# Patient Record
Sex: Male | Born: 1965 | Race: Black or African American | Hispanic: No | Marital: Married | Smoking: Former smoker
Health system: Southern US, Community
[De-identification: ages and names within clinical notes are randomized; demographics above are authoritative.]

## PROBLEM LIST (undated history)

## (undated) DIAGNOSIS — I10 Essential (primary) hypertension: Secondary | ICD-10-CM

## (undated) DIAGNOSIS — N281 Cyst of kidney, acquired: Secondary | ICD-10-CM

## (undated) DIAGNOSIS — R7303 Prediabetes: Secondary | ICD-10-CM

## (undated) DIAGNOSIS — N289 Disorder of kidney and ureter, unspecified: Secondary | ICD-10-CM

## (undated) DIAGNOSIS — G4730 Sleep apnea, unspecified: Secondary | ICD-10-CM

## (undated) DIAGNOSIS — Q619 Cystic kidney disease, unspecified: Secondary | ICD-10-CM

## (undated) DIAGNOSIS — K219 Gastro-esophageal reflux disease without esophagitis: Secondary | ICD-10-CM

## (undated) DIAGNOSIS — J302 Other seasonal allergic rhinitis: Secondary | ICD-10-CM

## (undated) DIAGNOSIS — D3500 Benign neoplasm of unspecified adrenal gland: Secondary | ICD-10-CM

## (undated) DIAGNOSIS — R972 Elevated prostate specific antigen [PSA]: Secondary | ICD-10-CM

## (undated) DIAGNOSIS — N400 Enlarged prostate without lower urinary tract symptoms: Secondary | ICD-10-CM

## (undated) DIAGNOSIS — M1990 Unspecified osteoarthritis, unspecified site: Secondary | ICD-10-CM

## (undated) DIAGNOSIS — N419 Inflammatory disease of prostate, unspecified: Secondary | ICD-10-CM

## (undated) DIAGNOSIS — N411 Chronic prostatitis: Secondary | ICD-10-CM

## (undated) DIAGNOSIS — M109 Gout, unspecified: Secondary | ICD-10-CM

## (undated) HISTORY — DX: Essential (primary) hypertension: I10

## (undated) HISTORY — DX: Benign prostatic hyperplasia without lower urinary tract symptoms: N40.0

## (undated) HISTORY — DX: Cyst of kidney, acquired: N28.1

## (undated) HISTORY — DX: Inflammatory disease of prostate, unspecified: N41.9

## (undated) HISTORY — DX: Other seasonal allergic rhinitis: J30.2

## (undated) HISTORY — DX: Sleep apnea, unspecified: G47.30

## (undated) HISTORY — PX: CYSTOSCOPY: SUR368

## (undated) HISTORY — DX: Prediabetes: R73.03

## (undated) HISTORY — DX: Disorder of kidney and ureter, unspecified: N28.9

## (undated) HISTORY — DX: Cystic kidney disease, unspecified: Q61.9

## (undated) HISTORY — DX: Chronic prostatitis: N41.1

## (undated) HISTORY — DX: Elevated prostate specific antigen (PSA): R97.20

---

## 2006-11-29 ENCOUNTER — Ambulatory Visit: Payer: Self-pay | Admitting: Family Medicine

## 2007-03-21 ENCOUNTER — Ambulatory Visit: Payer: Self-pay | Admitting: Family Medicine

## 2007-05-30 ENCOUNTER — Ambulatory Visit: Payer: Self-pay | Admitting: Family Medicine

## 2008-04-04 ENCOUNTER — Ambulatory Visit: Payer: Self-pay | Admitting: Family Medicine

## 2011-10-21 ENCOUNTER — Encounter: Payer: Self-pay | Admitting: Cardiovascular Disease

## 2011-10-21 LAB — HEMOGLOBIN: HGB: 17.7 g/dL (ref 13.0–18.0)

## 2011-10-27 LAB — CBC WITH DIFFERENTIAL/PLATELET
Basophil #: 0 10*3/uL (ref 0.0–0.1)
Eosinophil %: 5.3 %
Lymphocyte #: 1.6 10*3/uL (ref 1.0–3.6)
MCH: 30.2 pg (ref 26.0–34.0)
MCV: 89 fL (ref 80–100)
Monocyte %: 11.8 %
Neutrophil %: 48.1 %
Platelet: 171 10*3/uL (ref 150–440)
RBC: 5.41 10*6/uL (ref 4.40–5.90)
RDW: 13.3 % (ref 11.5–14.5)
WBC: 4.7 10*3/uL (ref 3.8–10.6)

## 2011-11-21 ENCOUNTER — Encounter: Payer: Self-pay | Admitting: Cardiovascular Disease

## 2011-12-02 ENCOUNTER — Ambulatory Visit: Payer: Self-pay | Admitting: Podiatry

## 2012-11-15 DIAGNOSIS — Q619 Cystic kidney disease, unspecified: Secondary | ICD-10-CM | POA: Insufficient documentation

## 2012-11-15 HISTORY — DX: Cystic kidney disease, unspecified: Q61.9

## 2013-12-26 DIAGNOSIS — J302 Other seasonal allergic rhinitis: Secondary | ICD-10-CM

## 2013-12-26 DIAGNOSIS — E669 Obesity, unspecified: Secondary | ICD-10-CM | POA: Insufficient documentation

## 2013-12-26 DIAGNOSIS — N281 Cyst of kidney, acquired: Secondary | ICD-10-CM

## 2013-12-26 DIAGNOSIS — E559 Vitamin D deficiency, unspecified: Secondary | ICD-10-CM | POA: Insufficient documentation

## 2013-12-26 HISTORY — DX: Cyst of kidney, acquired: N28.1

## 2013-12-26 HISTORY — DX: Other seasonal allergic rhinitis: J30.2

## 2014-01-22 DIAGNOSIS — M109 Gout, unspecified: Secondary | ICD-10-CM | POA: Insufficient documentation

## 2014-01-22 DIAGNOSIS — R7302 Impaired glucose tolerance (oral): Secondary | ICD-10-CM | POA: Insufficient documentation

## 2014-01-22 DIAGNOSIS — R7303 Prediabetes: Secondary | ICD-10-CM

## 2014-01-22 HISTORY — DX: Prediabetes: R73.03

## 2014-10-16 ENCOUNTER — Other Ambulatory Visit: Payer: Self-pay | Admitting: Urology

## 2014-10-21 DIAGNOSIS — N419 Inflammatory disease of prostate, unspecified: Secondary | ICD-10-CM | POA: Insufficient documentation

## 2014-10-21 HISTORY — DX: Inflammatory disease of prostate, unspecified: N41.9

## 2014-11-21 ENCOUNTER — Ambulatory Visit (INDEPENDENT_AMBULATORY_CARE_PROVIDER_SITE_OTHER): Payer: PRIVATE HEALTH INSURANCE | Admitting: Obstetrics and Gynecology

## 2014-11-21 ENCOUNTER — Encounter: Payer: Self-pay | Admitting: Obstetrics and Gynecology

## 2014-11-21 VITALS — BP 131/76 | HR 49 | Ht 74.0 in | Wt 306.6 lb

## 2014-11-21 DIAGNOSIS — IMO0002 Reserved for concepts with insufficient information to code with codable children: Secondary | ICD-10-CM | POA: Insufficient documentation

## 2014-11-21 DIAGNOSIS — I10 Essential (primary) hypertension: Secondary | ICD-10-CM

## 2014-11-21 DIAGNOSIS — N411 Chronic prostatitis: Secondary | ICD-10-CM | POA: Diagnosis not present

## 2014-11-21 DIAGNOSIS — N289 Disorder of kidney and ureter, unspecified: Secondary | ICD-10-CM

## 2014-11-21 DIAGNOSIS — G4730 Sleep apnea, unspecified: Secondary | ICD-10-CM | POA: Insufficient documentation

## 2014-11-21 DIAGNOSIS — N400 Enlarged prostate without lower urinary tract symptoms: Secondary | ICD-10-CM | POA: Insufficient documentation

## 2014-11-21 HISTORY — DX: Benign prostatic hyperplasia without lower urinary tract symptoms: N40.0

## 2014-11-21 HISTORY — DX: Disorder of kidney and ureter, unspecified: N28.9

## 2014-11-21 HISTORY — DX: Essential (primary) hypertension: I10

## 2014-11-21 LAB — URINALYSIS, COMPLETE
Bilirubin, UA: NEGATIVE
GLUCOSE, UA: NEGATIVE
Ketones, UA: NEGATIVE
LEUKOCYTES UA: NEGATIVE
Nitrite, UA: NEGATIVE
PROTEIN UA: NEGATIVE
SPEC GRAV UA: 1.015 (ref 1.005–1.030)
Urobilinogen, Ur: 0.2 mg/dL (ref 0.2–1.0)
pH, UA: 5.5 (ref 5.0–7.5)

## 2014-11-21 LAB — MICROSCOPIC EXAMINATION: RBC MICROSCOPIC, UA: NONE SEEN /HPF (ref 0–?)

## 2014-11-21 NOTE — Progress Notes (Signed)
11/21/2014 9:14 PM   Jason Tran 02-07-1966 [id number]  Referring provider: No referring provider defined for this encounter.  Chief Complaint  Patient presents with  . Prostatitis    x 4 mth f/u     HPI: Mr. Jason Tran is a 49 year old African-American male presenting for his 4 month follow-up for chronic prostatitis. He was last seen by [name] on 08/02/58. A cystoscopy was performed at that time was unremarkable except for mild inflammation of prostate. Patient was instructed to continue taking Bactrim for at least 2 more months and was given refills. Patient reports he has been taking daily Bactrim for the last 4 months and also a mail order medication called super beta prostate. He states that his symptoms have significantly improved including his urinary frequency and perineal discomfort. He states that he just got another refill for Bactrim for 1 month and was planning on completing it prior to taking a break from antibiotics to see if symptoms return.  07/02/14 PSA 0.4    PMH: Past Medical History  Diagnosis Date  . Sleep apnea   . HTN (hypertension)   . Renal disease   . BPH (benign prostatic hyperplasia)   . Chronic prostatitis   . Elevated PSA   . BP (high blood pressure) 11/21/2014  . Allergic rhinitis, seasonal 12/26/2013  . 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 Prostatitis 10/21/2014  . Disorder of kidney 11/21/2014    Surgical History: Past Surgical History  Procedure Laterality Date  . Cystoscopy      Home Medications:    Medication List       This list is accurate as of: 11/21/14  9:14 PM.  Always use your most recent med list.               eplerenone 50 MG tablet  Commonly known as:  INSPRA     fluticasone 50 MCG/ACT nasal spray  Commonly known as:  FLONASE     furosemide 20 MG tablet    Commonly known as:  LASIX     loratadine 10 MG tablet  Commonly known as:  CLARITIN     metoprolol 200 MG 24 hr tablet  Commonly known as:  TOPROL-XL     sulfamethoxazole-trimethoprim 800-160 MG per tablet  Commonly known as:  BACTRIM DS,SEPTRA DS  take 1 tablet by mouth once daily     tamsulosin 0.4 MG Caps capsule  Commonly known as:  FLOMAX     TRIBENZOR 40-10-25 MG Tabs  Generic drug:  Olmesartan-Amlodipine-HCTZ     Vitamin D (Ergocalciferol) 50000 UNITS Caps capsule  Commonly known as:  DRISDOL        Allergies: No Known Allergies  Family History: Family History  Problem Relation Age of Onset  . Benign prostatic hyperplasia    . Hypertension      Social History:  reports that he quit smoking about 8 years ago. He does not have any smokeless tobacco history on file. He reports that he drinks alcohol. He reports that he does not use illicit drugs.  ROS: UROLOGY Frequent Urination?: No Hard to postpone urination?: No Burning/pain with urination?: No Get up at night to urinate?: No Leakage of urine?: No Urine stream starts and stops?: No Trouble starting stream?: No Do you have to strain to urinate?: No Blood in urine?: No Urinary  tract infection?: No Sexually transmitted disease?: No Injury to kidneys or bladder?: No Painful intercourse?: No Weak stream?: No Erection problems?: No Penile pain?: No  Gastrointestinal Nausea?: No Vomiting?: No Indigestion/heartburn?: No Diarrhea?: No Constipation?: No  Constitutional Fever: No Night sweats?: No Weight loss?: No Fatigue?: No  Skin Skin rash/lesions?: No Itching?: No  Eyes Blurred vision?: No Double vision?: No  Ears/Nose/Throat Sore throat?: No Sinus problems?: No  Hematologic/Lymphatic Swollen glands?: Yes Easy bruising?: No  Cardiovascular Leg swelling?: No Chest pain?: No  Respiratory Cough?: No Shortness of breath?: No  Endocrine Excessive thirst?:  No  Musculoskeletal Back pain?: No Joint pain?: No  Neurological Headaches?: No Dizziness?: No  Psychologic Depression?: No Anxiety?: No  Physical Exam: BP 131/76 mmHg  Pulse 49  Ht 6\' 2"  (1.88 m)  Wt 306 lb 9.6 oz (139.073 kg)  BMI 39.35 kg/m2  Constitutional:  Alert and oriented, No acute distress. HEENT: [place] AT, moist mucus membranes.  Trachea midline, no masses. Cardiovascular: No clubbing, cyanosis, or edema. Respiratory: Normal respiratory effort, no increased work of breathing. GU: No CVA tenderness. Skin: No rashes, bruises or suspicious lesions. Lymph: No cervical adenopathy. Neurologic: Grossly intact, no focal deficits, moving all 4 extremities. Psychiatric: Normal mood and affect.  Laboratory Data: Results for orders placed or performed in visit on 11/21/14  Microscopic Examination  Result Value Ref Range   WBC, UA 0-5 0 -  5 /hpf   RBC, UA None seen 0 -  2 /hpf   Epithelial Cells (non renal) 0-10 0 - 10 /hpf   Bacteria, UA Few (A) None seen/Few  Urinalysis, Complete  Result Value Ref Range   Specific Gravity, UA 1.015 1.005 - 1.030   pH, UA 5.5 5.0 - 7.5   Color, UA Yellow Yellow   Appearance Ur Clear Clear   Leukocytes, UA Negative Negative   Protein, UA Negative Negative/Trace   Glucose, UA Negative Negative   Ketones, UA Negative Negative   RBC, UA Trace (A) Negative   Bilirubin, UA Negative Negative   Urobilinogen, Ur 0.2 0.2 - 1.0 mg/dL   Nitrite, UA Negative Negative   Microscopic Examination See below:     Lab Results  Component Value Date   WBC 4.7 10/27/2011   HGB 16.4 10/27/2011   HCT 47.9 10/27/2011   MCV 89 10/27/2011   PLT 171 10/27/2011    No results found for: CREATININE  No results found for: PSA  No results found for: TESTOSTERONE  No results found for: HGBA1C  Assessment & Plan:  49 year old African-American male with chronic prostatitis.  1. Chronic prostatitis- Patient has completed a 4 month course of Bactrim  and medication called beta prostate with significant improvement in symptoms of frequency and perineal pain. He will complete 1 more month of Bactrim and then stop antibiotics for a trial period. - Urinalysis, Complete  Return in about 6 months (around 05/21/2015) for recheck prostatitis physician.  Herbert Moors, [facility] [address], [place], [place] [address] [phone]

## 2015-05-21 ENCOUNTER — Encounter: Payer: Self-pay | Admitting: Obstetrics and Gynecology

## 2015-05-21 ENCOUNTER — Ambulatory Visit (INDEPENDENT_AMBULATORY_CARE_PROVIDER_SITE_OTHER): Payer: PRIVATE HEALTH INSURANCE | Admitting: Obstetrics and Gynecology

## 2015-05-21 VITALS — BP 160/94 | HR 44 | Resp 16 | Ht 74.0 in | Wt 308.1 lb

## 2015-05-21 DIAGNOSIS — N411 Chronic prostatitis: Secondary | ICD-10-CM

## 2015-05-21 LAB — BLADDER SCAN AMB NON-IMAGING: Scan Result: 17

## 2015-05-21 NOTE — Progress Notes (Signed)
11:48 AM   Jason Tran 10-02-1965 [id number]  Referring provider: [name], MD [place], [place] [address]  Chief Complaint  Patient presents with  . Prostatitis  . Follow-up    HPI: Jason Tran is a 50 year old African-American male presenting for his 4 month follow-up for chronic prostatitis. He was last seen by [name] on 08/01/49. A cystoscopy was performed at that time was unremarkable except for mild inflammation of prostate. Patient was instructed to continue taking Bactrim for at least 2 more months and was given refills. Patient reports he has been taking daily Bactrim for the last 4 months and also a mail order medication called super beta prostate. He states that his symptoms have significantly improved including his urinary frequency and perineal discomfort. He states that he just got another refill for Bactrim for 1 month and was planning on completing it prior to taking a break from antibiotics to see if symptoms return.  07/02/14 PSA 0.4  Interval History 05/22/15 Patient states that he has not been taking any antibiotics over the last few months. He reports his urinary symptoms are much improved including urinary frequency and perineal discomfort. He feels comfortable staying off antibiotics at this time. He is still taking daily tamsulosin.     PMH: Past Medical History  Diagnosis Date  . Sleep apnea   . HTN (hypertension)   . Renal disease   . BPH (benign prostatic hyperplasia)   . Chronic prostatitis   . Elevated PSA   . BP (high blood pressure) 11/21/2014  . Allergic rhinitis, seasonal 12/26/2013  . 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 Prostatitis 10/21/2014  . Disorder of kidney 11/21/2014    Surgical History: Past Surgical History  Procedure  Laterality Date  . Cystoscopy      Home Medications:    Medication List       This list is accurate as of: 05/21/15 11:59 PM.  Always use your most recent med list.               eplerenone 50 MG tablet  Commonly known as:  INSPRA     fluticasone 50 MCG/ACT nasal spray  Commonly known as:  FLONASE     furosemide 20 MG tablet  Commonly known as:  LASIX     indomethacin 50 MG capsule  Commonly known as:  INDOCIN  Reported on 05/21/2015     loratadine 10 MG tablet  Commonly known as:  CLARITIN     metoprolol 200 MG 24 hr tablet  Commonly known as:  TOPROL-XL     RA VITAMIN B-12 TR 1000 MCG Tbcr  Generic drug:  Cyanocobalamin  Take by mouth.     tamsulosin 0.4 MG Caps capsule  Commonly known as:  FLOMAX     TRIBENZOR 40-10-25 MG Tabs  Generic drug:  Olmesartan-Amlodipine-HCTZ     Vitamin D (Ergocalciferol) 50000 units Caps capsule  Commonly known as:  DRISDOL        Allergies: No Known Allergies  Family History: Family History  Problem Relation Age of Onset  . Benign prostatic hyperplasia    . Hypertension      Social History:  reports that he quit smoking about 8 years ago. He does not have any smokeless tobacco history on  file. He reports that he drinks alcohol. He reports that he does not use illicit drugs.  ROS: UROLOGY Frequent Urination?: No Hard to postpone urination?: No Burning/pain with urination?: No Get up at night to urinate?: No Leakage of urine?: No Urine stream starts and stops?: No Trouble starting stream?: No Do you have to strain to urinate?: No Blood in urine?: No Urinary tract infection?: No Sexually transmitted disease?: No Injury to kidneys or bladder?: No Painful intercourse?: No Weak stream?: No Erection problems?: No Penile pain?: No  Gastrointestinal Nausea?: No Vomiting?: No Indigestion/heartburn?: No Diarrhea?: No Constipation?: No  Constitutional Fever: No Night sweats?: No Weight loss?: No Fatigue?:  No  Skin Skin rash/lesions?: No Itching?: No  Eyes Blurred vision?: No Double vision?: No  Ears/Nose/Throat Sore throat?: No Sinus problems?: No  Hematologic/Lymphatic Swollen glands?: No Easy bruising?: No  Cardiovascular Leg swelling?: No Chest pain?: No  Respiratory Cough?: No Shortness of breath?: No  Endocrine Excessive thirst?: No  Musculoskeletal Back pain?: No Joint pain?: No  Neurological Headaches?: No Dizziness?: No  Psychologic Depression?: No Anxiety?: No  Physical Exam: BP 160/94 mmHg  Pulse 44  Resp 16  Ht 6\' 2"  (1.88 m)  Wt 308 lb 1.6 oz (139.753 kg)  BMI 39.54 kg/m2  Constitutional:  Alert and oriented, No acute distress. HEENT: [place] AT, moist mucus membranes.  Trachea midline, no masses. Cardiovascular: No clubbing, cyanosis, or edema. Respiratory: Normal respiratory effort, no increased work of breathing. GU: No CVA tenderness. Skin: No rashes, bruises or suspicious lesions. Lymph: No cervical adenopathy. Neurologic: Grossly intact, no focal deficits, moving all 4 extremities. Psychiatric: Normal mood and affect.  Laboratory Data: Results for orders placed or performed in visit on 05/21/15  BLADDER SCAN AMB NON-IMAGING  Result Value Ref Range   Scan Result 17 mL     Lab Results  Component Value Date   WBC 4.7 10/27/2011   HGB 16.4 10/27/2011   HCT 47.9 10/27/2011   MCV 89 10/27/2011   PLT 171 10/27/2011    No results found for: CREATININE  No results found for: PSA  No results found for: TESTOSTERONE  No results found for: HGBA1C  Assessment & Plan:  50 year old African-American male with chronic prostatitis.  1. Chronic prostatitis- suspected prostatitis resolved. No further antibiotics unless patient becomes acutely symptomatic. We'll continue Flomax. Follow-up for prostate cancer screening.  Patient reports significant improvement in perineal discomfort and frequency.  Symptoms well managed on Flomax.  Patient  not interested in Finasteride d/t possible side effect of erectile dysfunction.   - Urinalysis, Complete  Return in about 2 months (around 07/21/2015) for prostate cancer screening DRE/PSA  with Larene [place].  Herbert Moors, [facility] [address], [place], [place] [address] [phone]

## 2015-07-21 ENCOUNTER — Ambulatory Visit: Payer: PRIVATE HEALTH INSURANCE | Admitting: Urology

## 2015-08-06 ENCOUNTER — Ambulatory Visit (INDEPENDENT_AMBULATORY_CARE_PROVIDER_SITE_OTHER): Payer: PRIVATE HEALTH INSURANCE | Admitting: Urology

## 2015-08-06 ENCOUNTER — Encounter: Payer: Self-pay | Admitting: Urology

## 2015-08-06 VITALS — BP 141/81 | HR 44 | Ht 74.5 in | Wt 306.1 lb

## 2015-08-06 DIAGNOSIS — N401 Enlarged prostate with lower urinary tract symptoms: Secondary | ICD-10-CM | POA: Diagnosis not present

## 2015-08-06 DIAGNOSIS — N411 Chronic prostatitis: Secondary | ICD-10-CM | POA: Diagnosis not present

## 2015-08-06 DIAGNOSIS — N138 Other obstructive and reflux uropathy: Secondary | ICD-10-CM

## 2015-08-06 NOTE — Progress Notes (Signed)
3:59 PM   Jason Tran 20-Jun-1965 [id number]  Referring provider: [name], MD [place], [place] [address]  Chief Complaint  Patient presents with  . Prostatitis    6 month follow up    HPI: Patient is a 50 year old African-American male who presents today for an annual visit for BPH with LUTS and a history of chronic prostatitis  BPH WITH LUTS His IPSS score today is 14, which is moderate lower urinary tract symptomatology. He is mixed with his quality life due to his urinary symptoms.  His major complaint today a weak urinary stream.  He has had these symptoms for 6-7 years.  He denies any dysuria, hematuria or suprapubic pain.  He currently taking tamsulosin 0.4 mg daily, which has helped improve the week urinary stream   He also denies any recent fevers, chills, nausea or vomiting.  He does not have a family history of PCa.      IPSS      08/06/15 1500       International Prostate Symptom Score   How often have you had the sensation of not emptying your bladder? About half the time     How often have you had to urinate less than every two hours? Less than 1 in 5 times     How often have you found you stopped and started again several times when you urinated? About half the time     How often have you found it difficult to postpone urination? Less than 1 in 5 times     How often have you had a weak urinary stream? More than half the time     How often have you had to strain to start urination? Not at All     How many times did you typically get up at night to urinate? 2 Times     Total IPSS Score 14     Quality of Life due to urinary symptoms   If you were to spend the rest of your life with your urinary condition just the way it is now how would you feel about that? Mixed        Score:  1-7 Mild 8-19 Moderate 20-35 Severe  Chronic prostatitis Patient underwent cystoscopy with [name] on 08/02/14 which  noted for mild inflammation of prostate. Patient was instructed to continue taking Bactrim for at least 2 more months and was given refills. Patient reported he took the Bactrim for the last 4 months and also a mail order medication called super beta prostate.  His Bactrim was discontinued and he was prescribed tamsulosin 0.4 mg daily.  He reports his urinary symptoms are much improved including urinary frequency and perineal discomfort. He feels comfortable staying off antibiotics at this time.  He is still taking daily tamsulosin.     PMH: Past Medical History  Diagnosis Date  . Sleep apnea   . HTN (hypertension)   . Renal disease   . BPH (benign prostatic hyperplasia)   . Chronic prostatitis   . Elevated PSA   . BP (high blood pressure) 11/21/2014  . Allergic rhinitis, seasonal 12/26/2013  . 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 Prostatitis 10/21/2014  . Disorder of kidney 11/21/2014    Surgical History: Past Surgical History  Procedure Laterality Date  . Cystoscopy      Home Medications:    Medication List       This list is accurate as of: 08/06/15  3:59 PM.  Always use your most recent med list.               EPINEPHrine 0.3 mg/0.3 mL Soaj injection  Commonly known as:  EPI-PEN  use as directed by prescriber     eplerenone 50 MG tablet  Commonly known as:  INSPRA     fluticasone 50 MCG/ACT nasal spray  Commonly known as:  FLONASE     furosemide 20 MG tablet  Commonly known as:  LASIX     indomethacin 50 MG capsule  Commonly known as:  INDOCIN  Reported on 08/06/2015     loratadine 10 MG tablet  Commonly known as:  CLARITIN     metoprolol 200 MG 24 hr tablet  Commonly known as:  TOPROL-XL     RA VITAMIN B-12 TR 1000 MCG Tbcr  Generic drug:  Cyanocobalamin  Take by mouth.     tamsulosin 0.4 MG Caps  capsule  Commonly known as:  FLOMAX     TRIBENZOR 40-10-25 MG Tabs  Generic drug:  Olmesartan-Amlodipine-HCTZ     Vitamin D (Ergocalciferol) 50000 units Caps capsule  Commonly known as:  DRISDOL        Allergies: No Known Allergies  Family History: Family History  Problem Relation Age of Onset  . Benign prostatic hyperplasia    . Hypertension    . Kidney disease Mother   . Kidney disease Maternal Uncle   . Prostate cancer Neg Hx     Social History:  reports that he quit smoking about 8 years ago. He does not have any smokeless tobacco history on file. He reports that he drinks alcohol. He reports that he does not use illicit drugs.  ROS: UROLOGY Frequent Urination?: No Hard to postpone urination?: No Burning/pain with urination?: No Get up at night to urinate?: No Leakage of urine?: No Urine stream starts and stops?: No Trouble starting stream?: No Do you have to strain to urinate?: No Blood in urine?: No Urinary tract infection?: No Sexually transmitted disease?: No Injury to kidneys or bladder?: No Painful intercourse?: No Weak stream?: No Erection problems?: No Penile pain?: No  Gastrointestinal Nausea?: No Vomiting?: No Indigestion/heartburn?: No Diarrhea?: No Constipation?: No  Constitutional Fever: No Night sweats?: No Weight loss?: No Fatigue?: No  Skin Skin rash/lesions?: No Itching?: No  Eyes Blurred vision?: No Double vision?: No  Ears/Nose/Throat Sore throat?: No Sinus problems?: No  Hematologic/Lymphatic Swollen glands?: No Easy bruising?: No  Cardiovascular Leg swelling?: No Chest pain?: No  Respiratory Cough?: No Shortness of breath?: No  Endocrine Excessive thirst?: No  Musculoskeletal Back pain?: No Joint pain?: No  Neurological Headaches?: No Dizziness?: No  Psychologic Depression?: No Anxiety?: No  Physical Exam: BP 141/81 mmHg  Pulse 44  Ht 6' 2.5" (1.892 m)  Wt 306 lb 1.6 oz (138.846 kg)  BMI  38.79 kg/m2  Constitutional: Well nourished. Alert and oriented, No acute distress. HEENT: [place] AT, moist mucus membranes. Trachea midline, no masses. Cardiovascular: No clubbing, cyanosis, or edema. Respiratory: Normal respiratory effort, no increased work of breathing. GI: Abdomen is soft, non tender, non distended, no abdominal masses. Liver and spleen not palpable.  No hernias appreciated.  Stool sample for occult testing is not indicated.   GU: No CVA tenderness.  No bladder fullness or masses.  Patient with circumcised phallus.  Urethral meatus is patent.  No penile discharge. No penile lesions or rashes. Scrotum without lesions, cysts, rashes and/or edema.  Testicles are located scrotally bilaterally. No masses are appreciated in the testicles. Left and right epididymis are normal. Rectal: Patient with  normal sphincter tone. Anus and perineum without scarring or rashes. No rectal masses are appreciated. Prostate is approximately 45 grams, no nodules are appreciated. Seminal vesicles are normal. Skin: No rashes, bruises or suspicious lesions. Lymph: No cervical or inguinal adenopathy. Neurologic: Grossly intact, no focal deficits, moving all 4 extremities. Psychiatric: Normal mood and affect.  Laboratory Data: Results for orders placed or performed in visit on 05/21/15  BLADDER SCAN AMB NON-IMAGING  Result Value Ref Range   Scan Result 17 mL     Lab Results  Component Value Date   WBC 4.7 10/27/2011   HGB 16.4 10/27/2011   HCT 47.9 10/27/2011   MCV 89 10/27/2011   PLT 171 10/27/2011    PSA history  0.4 ng/mL on 07/02/2014   Assessment & Plan:    1. Chronic prostatitis- suspected prostatitis resolved. No further antibiotics unless patient becomes acutely symptomatic. We'll continue Flomax. Patient not interested in Finasteride d/t possible side effect of erectile dysfunction.   2. BPH with LUTS:    IPSS score is 14/3.  Continue tamsulosin 0.4 mg daily.  RTC in 12 months for  IPSS, PSA and exam   Return in about 1 year (around 08/05/2016) for IPSS and exam.  [name], [facility]  [facility] [address] [place], [place] [address] [phone]

## 2015-08-07 ENCOUNTER — Telehealth: Payer: Self-pay

## 2015-08-07 LAB — PSA: PROSTATE SPECIFIC AG, SERUM: 0.4 ng/mL (ref 0.0–4.0)

## 2015-08-07 NOTE — Telephone Encounter (Signed)
-----   Message from Nori Riis, PA-C sent at 08/07/2015  8:06 AM EDT ----- PSA is stable.

## 2015-08-07 NOTE — Telephone Encounter (Signed)
Spoke with pt in reference to PSA results. Pt voiced understanding.  

## 2015-08-09 DIAGNOSIS — N138 Other obstructive and reflux uropathy: Secondary | ICD-10-CM | POA: Insufficient documentation

## 2015-08-09 DIAGNOSIS — N401 Enlarged prostate with lower urinary tract symptoms: Secondary | ICD-10-CM

## 2015-08-27 ENCOUNTER — Ambulatory Visit (INDEPENDENT_AMBULATORY_CARE_PROVIDER_SITE_OTHER): Payer: PRIVATE HEALTH INSURANCE | Admitting: Urology

## 2015-08-27 ENCOUNTER — Encounter: Payer: Self-pay | Admitting: Urology

## 2015-08-27 ENCOUNTER — Telehealth: Payer: Self-pay | Admitting: Urology

## 2015-08-27 VITALS — BP 157/79 | HR 52 | Ht 74.5 in | Wt 308.5 lb

## 2015-08-27 DIAGNOSIS — R3129 Other microscopic hematuria: Secondary | ICD-10-CM | POA: Diagnosis not present

## 2015-08-27 DIAGNOSIS — R3911 Hesitancy of micturition: Secondary | ICD-10-CM

## 2015-08-27 DIAGNOSIS — R310 Gross hematuria: Secondary | ICD-10-CM | POA: Diagnosis not present

## 2015-08-27 DIAGNOSIS — R399 Unspecified symptoms and signs involving the genitourinary system: Secondary | ICD-10-CM

## 2015-08-27 LAB — MICROSCOPIC EXAMINATION: BACTERIA UA: NONE SEEN

## 2015-08-27 LAB — BLADDER SCAN AMB NON-IMAGING: Scan Result: 207

## 2015-08-27 LAB — URINALYSIS, COMPLETE
Bilirubin, UA: NEGATIVE
GLUCOSE, UA: NEGATIVE
Ketones, UA: NEGATIVE
LEUKOCYTES UA: NEGATIVE
Nitrite, UA: NEGATIVE
PH UA: 5.5 (ref 5.0–7.5)
PROTEIN UA: NEGATIVE
RBC, UA: NEGATIVE
Specific Gravity, UA: <1.005 — ABNORMAL LOW (ref 1.005–1.030)
Urobilinogen, Ur: 0.2 mg/dL (ref 0.2–1.0)

## 2015-08-27 NOTE — Progress Notes (Signed)
08/27/2015 10:41 AM   Jason Tran 06-May-1965 [id number]  Referring provider: [name], MD [place], [place] [address]  Chief Complaint  Patient presents with  . trouble urinating    follow up  . Hematuria    HPI: Patient is a 50 -year-old [place] American male who presents today as a referral from their PCP, Dr Donetta Potts, for gross hematuria.  Patient stated he had 2 episodes of gross hematuria since his last visit with [place] on month ago.  He does have a history of chronic prostatitis and BPH.  He does not have a prior history of recurrent urinary tract infections, nephrolithiasis, trauma to the genitourinary tract or malignancies of the genitourinary tract.   He does not have a family medical history of nephrolithiasis, malignancies of the genitourinary tract or hematuria.   Today, he is having symptoms of urgency, dysuria, nocturia, incontinence, hesitancy, intermittency, straining to urinate or a weak urinary stream.  He is not experiencing frequent urination.  His UA today is unremarkable.  He is currently on tamsulosin 0.4 mg daily. His PVR today was 207 mL.  IPSS score 19/5.      IPSS      08/06/15 1500 08/27/15 1000     International Prostate Symptom Score   How often have you had the sensation of not emptying your bladder? About half the time More than half the time    How often have you had to urinate less than every two hours? Less than 1 in 5 times Less than half the time    How often have you found you stopped and started again several times when you urinated? About half the time More than half the time    How often have you found it difficult to postpone urination? Less than 1 in 5 times About half the time    How often have you had a weak urinary stream? More than half the time Less than half the time    How often have you had to strain to start urination? Not at All Less than half the time    How many  times did you typically get up at night to urinate? 2 Times 2 Times    Total IPSS Score 14 19    Quality of Life due to urinary symptoms   If you were to spend the rest of your life with your urinary condition just the way it is now how would you feel about that? Mixed Unhappy       Score:  1-7 Mild 8-19 Moderate 20-35 Severe  He is not experiencing any suprapubic pain, abdominal pain or flank pain. He denies any recent fevers, chills, nausea or vomiting.   He has not had any recent imaging studies. He did undergo cystoscopic examination on 08/02/2014 with [name] and was found to have mild inflammation of the prostate.  He is a former smoker, with a 1 ppd history.  Quit 8 years ago.  He is not exposed to secondhand smoke.  He has worked with [employer].   PMH: Past Medical History  Diagnosis Date  . Sleep apnea   . HTN (hypertension)   . Renal disease   . BPH (benign prostatic hyperplasia)   . Chronic prostatitis   . Elevated PSA   . BP (high blood pressure) 11/21/2014  . Allergic rhinitis, seasonal 12/26/2013  . 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 Prostatitis 10/21/2014  . Disorder of kidney 11/21/2014    Surgical History: Past Surgical History  Procedure Laterality Date  . Cystoscopy      Home Medications:    Medication List       This list is accurate as of: 08/27/15 10:41 AM.  Always use your most recent med list.               EPINEPHrine 0.3 mg/0.3 mL Soaj injection  Commonly known as:  EPI-PEN  Reported on 08/27/2015     eplerenone 50 MG tablet  Commonly known as:  INSPRA  Reported on 08/27/2015     fluticasone 50 MCG/ACT nasal spray  Commonly known as:  FLONASE     furosemide 20 MG tablet  Commonly known as:  LASIX     indomethacin 50 MG capsule  Commonly known as:  INDOCIN    Reported on 08/06/2015     loratadine 10 MG tablet  Commonly known as:  CLARITIN     metoprolol 200 MG 24 hr tablet  Commonly known as:  TOPROL-XL     RA VITAMIN B-12 TR 1000 MCG Tbcr  Generic drug:  Cyanocobalamin  Take by mouth.     tamsulosin 0.4 MG Caps capsule  Commonly known as:  FLOMAX     TRIBENZOR 40-10-25 MG Tabs  Generic drug:  Olmesartan-Amlodipine-HCTZ     Vitamin D (Ergocalciferol) 50000 units Caps capsule  Commonly known as:  DRISDOL        Allergies: No Known Allergies  Family History: Family History  Problem Relation Age of Onset  . Benign prostatic hyperplasia    . Hypertension    . Kidney disease Mother   . Kidney disease Maternal Uncle   . Prostate cancer Neg Hx     Social History:  reports that he quit smoking about 8 years ago. He does not have any smokeless tobacco history on file. He reports that he drinks alcohol. He reports that he does not use illicit drugs.  ROS: UROLOGY Frequent Urination?: No Hard to postpone urination?: Yes Burning/pain with urination?: Yes Get up at night to urinate?: Yes Leakage of urine?: Yes Urine stream starts and stops?: Yes Trouble starting stream?: Yes Do you have to strain to urinate?: Yes Blood in urine?: Yes Urinary tract infection?: No Sexually transmitted disease?: No Injury to kidneys or bladder?: No Painful intercourse?: No Weak stream?: Yes Erection problems?: Yes Penile pain?: No  Gastrointestinal Nausea?: No Vomiting?: No Indigestion/heartburn?: No Diarrhea?: No Constipation?: Yes  Constitutional Fever: No Night sweats?: No Weight loss?: No Fatigue?: Yes  Skin Skin rash/lesions?: No Itching?: No  Eyes Blurred vision?: No Double vision?: No  Ears/Nose/Throat Sore throat?: No Sinus problems?: Yes  Hematologic/Lymphatic Swollen glands?: No Easy bruising?: No  Cardiovascular Leg swelling?: No Chest pain?: No  Respiratory Cough?: No Shortness of breath?:  No  Endocrine Excessive thirst?: No  Musculoskeletal Back pain?: No Joint pain?: No  Neurological Headaches?: No Dizziness?: No  Psychologic Depression?: No Anxiety?: No  Physical Exam: BP 157/79 mmHg  Pulse 52  Ht 6' 2.5" (1.892 m)  Wt 308 lb 8 oz (139.935 kg)  BMI 39.09 kg/m2  Constitutional: Well nourished. Alert and oriented, No acute distress. HEENT: [place] AT, moist mucus membranes. Trachea midline, no masses. Cardiovascular: No clubbing, cyanosis, or edema. Respiratory: Normal respiratory effort, no increased work of breathing. Skin:  No rashes, bruises or suspicious lesions. Lymph: No cervical or inguinal adenopathy. Neurologic: Grossly intact, no focal deficits, moving all 4 extremities. Psychiatric: Normal mood and affect.  Laboratory Data: Lab Results  Component Value Date   WBC 4.7 10/27/2011   HGB 16.4 10/27/2011   HCT 47.9 10/27/2011   MCV 89 10/27/2011   PLT 171 10/27/2011    PSA history  0.4 ng/mL on 07/02/2014  Urinalysis Results for orders placed or performed in visit on 08/27/15  Microscopic Examination  Result Value Ref Range   WBC, UA 0-5 0 -  5 /hpf   RBC, UA 0-2 0 -  2 /hpf   Epithelial Cells (non renal) 0-10 0 - 10 /hpf   Bacteria, UA None seen None seen/Few  Urinalysis, Complete  Result Value Ref Range   Specific Gravity, UA <1.005 (L) 1.005 - 1.030   pH, UA 5.5 5.0 - 7.5   Color, UA Straw Yellow   Appearance Ur Clear Clear   Leukocytes, UA Negative Negative   Protein, UA Negative Negative/Trace   Glucose, UA Negative Negative   Ketones, UA Negative Negative   RBC, UA Negative Negative   Bilirubin, UA Negative Negative   Urobilinogen, Ur 0.2 0.2 - 1.0 mg/dL   Nitrite, UA Negative Negative   Microscopic Examination See below:   BUN+Creat  Result Value Ref Range   BUN 14 6 - 24 mg/dL   Creatinine, Ser 1.24 0.76 - 1.27 mg/dL   GFR calc non Af Amer 67 >59 mL/min/1.73   GFR calc Af Amer 78 >59 mL/min/1.73   BUN/Creatinine  Ratio 11 9 - 20  BLADDER SCAN AMB NON-IMAGING  Result Value Ref Range   Scan Result 207    Pertinent Imaging: Results for SAYER, DERBYSHIRE (MRN [id number]) as of 09/01/2015 05:27  Ref. Range 08/27/2015 10:17  Scan Result Unknown 207    Assessment & Plan:    1. Gross hematuria:    I explained to the patient that there are a number of causes that can be associated with blood in the urine, such as stones,  BPH, UTI's, damage to the urinary tract and/or cancer.  At this time, I felt that the patient warranted further urologic evaluation.   The AUA guidelines state that a CT urogram is the preferred imaging study to evaluate hematuria.  I explained to the patient that a contrast material will be injected into a vein and that in rare instances, an allergic reaction can result and may even life threatening   The patient denies any allergies to contrast, iodine and/or seafood and is not taking metformin.  The patient had the opportunity to ask questions which were answered. Based upon this discussion, the patient is willing to proceed. Therefore, I've ordered: a CT Urogram.  He will return following all of the above for discussion of the results.     - Urinalysis, Complete  2. Hesitancy:   Patient is currently on tamsulosin 0.4 mg daily. He did not want to initiate finasteride at his last visit due to possible sexual side effects. We will readdress this issue if CT urogram does not find an etiology for his urinary symptoms.  - BLADDER SCAN AMB NON-IMAGING   Return for CT Urogram report.  These notes generated with voice recognition software. I apologize for typographical errors.  [name], [facility] [address] [place], [place] [address] [phone]

## 2015-08-27 NOTE — Telephone Encounter (Signed)
I called to do the pre-cert and I could not finish it because I need to note closed.   Thanks,  Sharyn Lull

## 2015-08-28 LAB — BUN+CREAT
BUN/Creatinine Ratio: 11 (ref 9–20)
BUN: 14 mg/dL (ref 6–24)
Creatinine, Ser: 1.24 mg/dL (ref 0.76–1.27)
GFR calc Af Amer: 78 mL/min/{1.73_m2} (ref >59)
GFR calc non Af Amer: 67 mL/min/{1.73_m2} (ref >59)

## 2015-09-01 DIAGNOSIS — R3911 Hesitancy of micturition: Secondary | ICD-10-CM | POA: Insufficient documentation

## 2015-09-01 NOTE — Telephone Encounter (Signed)
Note is finished

## 2015-09-11 ENCOUNTER — Ambulatory Visit (INDEPENDENT_AMBULATORY_CARE_PROVIDER_SITE_OTHER): Payer: PRIVATE HEALTH INSURANCE | Admitting: Urology

## 2015-09-11 ENCOUNTER — Encounter: Payer: Self-pay | Admitting: Urology

## 2015-09-11 ENCOUNTER — Ambulatory Visit
Admission: RE | Admit: 2015-09-11 | Discharge: 2015-09-11 | Disposition: A | Discharge status: Home / Self Care | Payer: PRIVATE HEALTH INSURANCE | Source: Ambulatory Visit | Attending: Urology | Admitting: Urology

## 2015-09-11 VITALS — BP 152/90 | HR 56 | Ht 74.0 in | Wt 303.0 lb

## 2015-09-11 DIAGNOSIS — R310 Gross hematuria: Secondary | ICD-10-CM

## 2015-09-11 DIAGNOSIS — R300 Dysuria: Secondary | ICD-10-CM | POA: Diagnosis not present

## 2015-09-11 DIAGNOSIS — D3502 Benign neoplasm of left adrenal gland: Secondary | ICD-10-CM | POA: Insufficient documentation

## 2015-09-11 DIAGNOSIS — D3501 Benign neoplasm of right adrenal gland: Secondary | ICD-10-CM | POA: Diagnosis not present

## 2015-09-11 LAB — URINALYSIS, COMPLETE
Bilirubin, UA: NEGATIVE
Glucose, UA: NEGATIVE
Ketones, UA: NEGATIVE
LEUKOCYTES UA: NEGATIVE
NITRITE UA: NEGATIVE
Protein, UA: NEGATIVE
SPEC GRAV UA: 1.01 (ref 1.005–1.030)
Urobilinogen, Ur: 0.2 mg/dL (ref 0.2–1.0)
pH, UA: 5.5 (ref 5.0–7.5)

## 2015-09-11 LAB — MICROSCOPIC EXAMINATION: Bacteria, UA: NONE SEEN

## 2015-09-11 MED ORDER — IOPAMIDOL (ISOVUE-300) INJECTION 61%
125.0000 mL | Freq: Once | INTRAVENOUS | Status: AC | PRN
  Administered 2015-09-11: 125 mL via INTRAVENOUS

## 2015-09-11 NOTE — Progress Notes (Signed)
3:35 PM   Jason Tran June 29, 1965 [id number]  Referring provider: [name], MD [address] [place], [place] [address]  Chief Complaint  Patient presents with  . Results    CT Urogram     HPI: Patient is a 50 year old African-American male who presents today to discuss his CT urogram results.  Background history Patient was referred from their PCP, Dr Donetta Potts, for gross hematuria.  Patient stated he had 2 episodes of gross hematuria since his last visit with [place] one month ago.  He does have a history of chronic prostatitis and BPH.  He does not have a prior history of recurrent urinary tract infections, nephrolithiasis, trauma to the genitourinary tract or malignancies of the genitourinary tract.  He does not have a family medical history of nephrolithiasis, malignancies of the genitourinary tract or hematuria.  He is not experiencing any suprapubic pain, abdominal pain or flank pain. He denies any recent fevers, chills, nausea or vomiting.  He has not had any recent imaging studies. He did undergo cystoscopic examination on 08/02/2014 with [name] and was found to have mild inflammation of the prostate.   He is a former smoker, with a 1 ppd history.  Quit 8 years ago.  He is not exposed to secondhand smoke.  He has worked with [employer].    Today, he is having symptoms of dysuria.   His UA today is unremarkable.  He is currently on tamsulosin 0.4 mg daily. His last PVR today was 207 mL.  His last IPSS score 19/5.  CT urogram completed on 09/11/2015 noted no renal, ureteral or bladder calculi or mass. Multiple benign-appearing renal cysts. Bilateral adrenal gland adenomas. No acute abdominal/pelvic findings, mass lesions or adenopathy.  I have reviewed the films with the patient.      IPSS      08/06/15 1500 08/27/15 1000     International Prostate Symptom Score   How often have you had the sensation of not emptying your  bladder? About half the time More than half the time    How often have you had to urinate less than every two hours? Less than 1 in 5 times Less than half the time    How often have you found you stopped and started again several times when you urinated? About half the time More than half the time    How often have you found it difficult to postpone urination? Less than 1 in 5 times About half the time    How often have you had a weak urinary stream? More than half the time Less than half the time    How often have you had to strain to start urination? Not at All Less than half the time    How many times did you typically get up at night to urinate? 2 Times 2 Times    Total IPSS Score 14 19    Quality of Life due to urinary symptoms   If you were to spend the rest of your life with your urinary condition just the way it is now how would you feel about that? Mixed Unhappy       Score:  1-7 Mild 8-19 Moderate 20-35 Severe   PMH: Past Medical History  Diagnosis Date  . Sleep apnea   . HTN (hypertension)   . Renal disease   . BPH (benign prostatic hyperplasia)   . Chronic prostatitis   .  Elevated PSA   . BP (high blood pressure) 11/21/2014  . Allergic rhinitis, seasonal 12/26/2013  . 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 Prostatitis 10/21/2014  . Disorder of kidney 11/21/2014    Surgical History: Past Surgical History  Procedure Laterality Date  . Cystoscopy      Home Medications:    Medication List       This list is accurate as of: 09/11/15  3:35 PM.  Always use your most recent med list.               allopurinol 100 MG tablet  Commonly known as:  ZYLOPRIM  Take by mouth.     EPINEPHrine 0.3 mg/0.3 mL Soaj injection  Commonly known as:  EPI-PEN  Reported on 08/27/2015     eplerenone 50 MG tablet  Commonly  known as:  INSPRA  Reported on 08/27/2015     fluticasone 50 MCG/ACT nasal spray  Commonly known as:  FLONASE     furosemide 20 MG tablet  Commonly known as:  LASIX     indomethacin 50 MG capsule  Commonly known as:  INDOCIN  Reported on 09/11/2015     loratadine 10 MG tablet  Commonly known as:  CLARITIN     metoprolol 200 MG 24 hr tablet  Commonly known as:  TOPROL-XL     RA VITAMIN B-12 TR 1000 MCG Tbcr  Generic drug:  Cyanocobalamin  Take by mouth.     tamsulosin 0.4 MG Caps capsule  Commonly known as:  FLOMAX     TRIBENZOR 40-10-25 MG Tabs  Generic drug:  Olmesartan-Amlodipine-HCTZ     Vitamin D (Ergocalciferol) 50000 units Caps capsule  Commonly known as:  DRISDOL        Allergies: No Known Allergies  Family History: Family History  Problem Relation Age of Onset  . Benign prostatic hyperplasia    . Hypertension    . Kidney disease Mother   . Kidney disease Maternal Uncle   . Prostate cancer Neg Hx     Social History:  reports that he quit smoking about 8 years ago. He does not have any smokeless tobacco history on file. He reports that he drinks alcohol. He reports that he does not use illicit drugs.  ROS: UROLOGY Frequent Urination?: No Hard to postpone urination?: No Burning/pain with urination?: Yes Get up at night to urinate?: No Leakage of urine?: No Urine stream starts and stops?: No Trouble starting stream?: No Do you have to strain to urinate?: No Blood in urine?: Yes Urinary tract infection?: No Sexually transmitted disease?: No Injury to kidneys or bladder?: No Painful intercourse?: No Weak stream?: No Erection problems?: No Penile pain?: No  Gastrointestinal Nausea?: No Vomiting?: No Indigestion/heartburn?: No Diarrhea?: No Constipation?: No  Constitutional Fever: No Night sweats?: No Weight loss?: No Fatigue?: No  Skin Skin rash/lesions?: No Itching?: No  Eyes Blurred vision?: No Double vision?:  No  Ears/Nose/Throat Sore throat?: No Sinus problems?: No  Hematologic/Lymphatic Swollen glands?: No Easy bruising?: No  Cardiovascular Leg swelling?: No Chest pain?: No  Respiratory Cough?: No Shortness of breath?: No  Endocrine Excessive thirst?: No  Musculoskeletal Back pain?: No Joint pain?: No  Neurological Headaches?: No Dizziness?: No  Psychologic Depression?: No Anxiety?: No  Physical Exam: BP 152/90 mmHg  Pulse 56  Ht 6\' 2"  (1.88 m)  Wt 303 lb (137.44 kg)  BMI 38.89 kg/m2  Constitutional: Well nourished. Alert and oriented, No acute distress. HEENT: [place] AT, moist mucus membranes. Trachea midline, no masses. Cardiovascular: No clubbing, cyanosis, or edema. Respiratory: Normal respiratory effort, no increased work of breathing. Skin: No rashes, bruises or suspicious lesions. Lymph: No cervical or inguinal adenopathy. Neurologic: Grossly intact, no focal deficits, moving all 4 extremities. Psychiatric: Normal mood and affect.  Laboratory Data: Lab Results  Component Value Date   WBC 4.7 10/27/2011   HGB 16.4 10/27/2011   HCT 47.9 10/27/2011   MCV 89 10/27/2011   PLT 171 10/27/2011    PSA history  0.4 ng/mL on 07/02/2014  0.4 ng/mL on 08/06/2015    Urinalysis Results for orders placed or performed in visit on 09/11/15  Microscopic Examination  Result Value Ref Range   WBC, UA 0-5 0 -  5 /hpf   RBC, UA 0-2 0 -  2 /hpf   Epithelial Cells (non renal) 0-10 0 - 10 /hpf   Bacteria, UA None seen None seen/Few  Urinalysis, Complete  Result Value Ref Range   Specific Gravity, UA 1.010 1.005 - 1.030   pH, UA 5.5 5.0 - 7.5   Color, UA Yellow Yellow   Appearance Ur Clear Clear   Leukocytes, UA Negative Negative   Protein, UA Negative Negative/Trace   Glucose, UA Negative Negative   Ketones, UA Negative Negative   RBC, UA Trace (A) Negative   Bilirubin, UA Negative Negative   Urobilinogen, Ur 0.2 0.2 - 1.0 mg/dL   Nitrite, UA Negative  Negative   Microscopic Examination See below:    Pertinent Imaging: CLINICAL DATA: Episode of gross hematuria 2 weeks ago.  EXAM: CT ABDOMEN AND PELVIS WITHOUT AND WITH CONTRAST  TECHNIQUE: Multidetector CT imaging of the abdomen and pelvis was performed following the standard protocol before and following the bolus administration of intravenous contrast.  CONTRAST: 142mL ISOVUE-300 IOPAMIDOL (ISOVUE-300) INJECTION 61%  COMPARISON: None.  FINDINGS: Lower chest: The lung bases are clear of acute process. No pleural effusion or pulmonary lesions. The heart is normal in size. No pericardial effusion. The distal esophagus and aorta are unremarkable.  Hepatobiliary: No focal hepatic lesions or intrahepatic biliary dilatation. The gallbladder is normal. No common bile duct dilatation.  Pancreas: No mass, inflammation or ductal dilatation.  Spleen: Normal size. No focal lesions.  Adrenals/Urinary Tract: There are bilateral adrenal gland lesions. The left adrenal gland lesion measures 3.5 cm but is less than 10 Hounsfield units on the precontrast images consistent with a benign adenoma. The right adrenal gland lesion measures 2.6 cm and also measures less than 10 Hounsfield units, consistent with benign adenoma.  There are multiple large left renal cysts and a moderate-sized right-sided parapelvic renal cysts. No worrisome renal lesions. No renal, ureteral or bladder calculi. No collecting system abnormality on the delayed images. Both kidneys demonstrate normal enhancement/ perfusion. Both ureters are normal. The bladder is normal. No bladder mass or bladder thickening. The prostate gland and seminal vesicles are unremarkable.  Stomach/Bowel: The stomach, duodenum, small bowel and colon are grossly normal without oral contrast. No inflammatory changes, mass lesions or obstructive findings. The terminal ileum is normal. The appendix is  normal.  Vascular/Lymphatic: No mesenteric or retroperitoneal mass or adenopathy. Small scattered lymph nodes are noted. Scattered aortic and iliac artery calcifications but no aneurysm or dissection. The branch vessels are patent. The major venous structures are patent.  Other: No pelvic mass or adenopathy. No free pelvic  fluid collections. No inguinal mass or adenopathy.  Musculoskeletal: No significant bony findings. Sclerotic lesion in the left acetabulum is likely a benign bone island.  IMPRESSION: 1. No renal, ureteral or bladder calculi or mass. Multiple benign appearing renal cysts. 2. Bilateral adrenal gland adenomas. 3. No acute abdominal/pelvic findings, mass lesions or adenopathy.   Electronically Signed  By: Marijo Sanes M.D.  On: 09/11/2015 09:42   Assessment & Plan:    1. Gross hematuria:    Patient has completed a CT urogram. No etiology was discovered for his gross hematuria.  He did undergo cystoscopic examination in May 2016 and mild inflammation of his prostate was noted. His cultures are pending at this time. If cultures return negative, I will schedule repeat cystoscopy.  - Urinalysis, Complete  2. Dysuria:   Patient has completed a CT urogram. No etiology was discovered for his dysuria. I will send his urine for culture and GC and chlamydia. If these are negative, we will proceed with repeat cystoscopy  Return for pending labs.  These notes generated with voice recognition software. I apologize for typographical errors.  [name], [facility] [address], [place], [place] [address] [phone]

## 2015-09-15 ENCOUNTER — Ambulatory Visit: Payer: PRIVATE HEALTH INSURANCE | Admitting: Anesthesiology

## 2015-09-15 ENCOUNTER — Ambulatory Visit
Admission: RE | Admit: 2015-09-15 | Discharge: 2015-09-15 | Disposition: A | Discharge status: Home / Self Care | Payer: PRIVATE HEALTH INSURANCE | Source: Ambulatory Visit | Attending: Unknown Physician Specialty | Admitting: Unknown Physician Specialty

## 2015-09-15 ENCOUNTER — Encounter
Admission: RE | Disposition: A | Discharge status: Home / Self Care | Payer: Self-pay | Source: Ambulatory Visit | Attending: Unknown Physician Specialty

## 2015-09-15 ENCOUNTER — Encounter: Payer: Self-pay | Admitting: *Deleted

## 2015-09-15 DIAGNOSIS — E669 Obesity, unspecified: Secondary | ICD-10-CM | POA: Diagnosis not present

## 2015-09-15 DIAGNOSIS — K648 Other hemorrhoids: Secondary | ICD-10-CM | POA: Insufficient documentation

## 2015-09-15 DIAGNOSIS — N400 Enlarged prostate without lower urinary tract symptoms: Secondary | ICD-10-CM | POA: Insufficient documentation

## 2015-09-15 DIAGNOSIS — D124 Benign neoplasm of descending colon: Secondary | ICD-10-CM | POA: Diagnosis not present

## 2015-09-15 DIAGNOSIS — I10 Essential (primary) hypertension: Secondary | ICD-10-CM | POA: Insufficient documentation

## 2015-09-15 DIAGNOSIS — M109 Gout, unspecified: Secondary | ICD-10-CM | POA: Insufficient documentation

## 2015-09-15 DIAGNOSIS — Z7951 Long term (current) use of inhaled steroids: Secondary | ICD-10-CM | POA: Diagnosis not present

## 2015-09-15 DIAGNOSIS — Z79899 Other long term (current) drug therapy: Secondary | ICD-10-CM | POA: Insufficient documentation

## 2015-09-15 DIAGNOSIS — Z87891 Personal history of nicotine dependence: Secondary | ICD-10-CM | POA: Insufficient documentation

## 2015-09-15 DIAGNOSIS — Z1211 Encounter for screening for malignant neoplasm of colon: Secondary | ICD-10-CM | POA: Insufficient documentation

## 2015-09-15 DIAGNOSIS — G4730 Sleep apnea, unspecified: Secondary | ICD-10-CM | POA: Insufficient documentation

## 2015-09-15 DIAGNOSIS — K9289 Other specified diseases of the digestive system: Secondary | ICD-10-CM | POA: Insufficient documentation

## 2015-09-15 HISTORY — DX: Gout, unspecified: M10.9

## 2015-09-15 HISTORY — PX: COLONOSCOPY WITH PROPOFOL: SHX5780

## 2015-09-15 SURGERY — COLONOSCOPY WITH PROPOFOL
Anesthesia: General

## 2015-09-15 MED ORDER — GLYCOPYRROLATE 0.2 MG/ML IJ SOLN
INTRAMUSCULAR | Status: DC | PRN
  Administered 2015-09-15: 0.2 mg via INTRAVENOUS

## 2015-09-15 MED ORDER — LIDOCAINE 2% (20 MG/ML) 5 ML SYRINGE
INTRAMUSCULAR | Status: DC | PRN
  Administered 2015-09-15: 40 mg via INTRAVENOUS

## 2015-09-15 MED ORDER — MIDAZOLAM HCL 5 MG/5ML IJ SOLN
INTRAMUSCULAR | Status: DC | PRN
  Administered 2015-09-15: 1 mg via INTRAVENOUS

## 2015-09-15 MED ORDER — PROPOFOL 500 MG/50ML IV EMUL
INTRAVENOUS | Status: DC | PRN
Start: 2015-09-15 — End: 2015-09-15
  Administered 2015-09-15: 200 ug/kg/min via INTRAVENOUS

## 2015-09-15 MED ORDER — SODIUM CHLORIDE 0.9 % IV SOLN
INTRAVENOUS | Status: DC
  Administered 2015-09-15: 15:00:00 via INTRAVENOUS

## 2015-09-15 MED ORDER — PROPOFOL 10 MG/ML IV BOLUS
INTRAVENOUS | Status: DC | PRN
  Administered 2015-09-15: 70 mg via INTRAVENOUS
  Administered 2015-09-15: 30 mg via INTRAVENOUS

## 2015-09-15 NOTE — Addendum Note (Signed)
Addendum  created 09/15/15 1618 by Aline Brochure, CRNA   Modules edited: Anesthesia Medication Administration

## 2015-09-15 NOTE — Anesthesia Postprocedure Evaluation (Signed)
Anesthesia Post Note  Patient: Jason Tran  Procedure(s) Performed: Procedure(s) (LRB): COLONOSCOPY WITH PROPOFOL (N/A)  Patient location during evaluation: Endoscopy Anesthesia Type: General Level of consciousness: awake and alert Pain management: pain level controlled Vital Signs Assessment: post-procedure vital signs reviewed and stable Respiratory status: spontaneous breathing and respiratory function stable Cardiovascular status: stable Anesthetic complications: no    Last Vitals:  Filed Vitals:   09/15/15 1556 09/15/15 1605  BP: 100/65 108/66  Pulse: 48 43  Temp: 35.9 C   Resp: 15 14    Last Pain: There were no vitals filed for this visit.               Darianne Muralles K

## 2015-09-15 NOTE — Anesthesia Preprocedure Evaluation (Addendum)
Anesthesia Evaluation  Patient identified by MRN, date of birth, ID band Patient awake    Reviewed: Allergy & Precautions, NPO status , Patient's Chart, lab work & pertinent test results, reviewed documented beta blocker date and time   Airway Mallampati: III  TM Distance: >3 FB     Dental  (+) Chipped   Pulmonary sleep apnea , former smoker,           Cardiovascular hypertension, Pt. on medications and Pt. on home beta blockers      Neuro/Psych    GI/Hepatic   Endo/Other    Renal/GU Renal InsufficiencyRenal disease     Musculoskeletal   Abdominal   Peds  Hematology   Anesthesia Other Findings Obese. Gout.  Reproductive/Obstetrics                            Anesthesia Physical Anesthesia Plan  ASA: III  Anesthesia Plan: General   Post-op Pain Management:    Induction: Intravenous  Airway Management Planned: Nasal Cannula  Additional Equipment:   Intra-op Plan:   Post-operative Plan:   Informed Consent: I have reviewed the patients History and Physical, chart, labs and discussed the procedure including the risks, benefits and alternatives for the proposed anesthesia with the patient or authorized representative who has indicated his/her understanding and acceptance.     Plan Discussed with: CRNA  Anesthesia Plan Comments:         Anesthesia Quick Evaluation

## 2015-09-15 NOTE — Transfer of Care (Signed)
Immediate Anesthesia Transfer of Care Note  Patient: Jason Tran  Procedure(s) Performed: Procedure(s): COLONOSCOPY WITH PROPOFOL (N/A)  Patient Location: Endoscopy Unit  Anesthesia Type:General  Level of Consciousness: sedated  Airway & Oxygen Therapy: Patient connected to nasal cannula oxygen  Post-op Assessment: Post -op Vital signs reviewed and stable  Post vital signs: stable  Last Vitals:  Filed Vitals:   09/15/15 1436 09/15/15 1556  BP: 159/105 100/65  Pulse: 48 48  Temp: 36.2 C 35.9 C  Resp: 18 15    Last Pain: There were no vitals filed for this visit.       Complications: No apparent anesthesia complications

## 2015-09-15 NOTE — Op Note (Signed)
[facility] Gastroenterology Patient Name: Jason Tran Procedure Date: 09/15/2015 3:24 PM MRN: [id number] Account #: [id number] Date of Birth: 05-Sep-1965 Admit Type: Outpatient Age: 50 Room: [facility] ENDO ROOM 1 Gender: Male Note Status: Finalized Procedure:            Colonoscopy Indications:          Screening for colorectal malignant neoplasm Providers:            [name], MD Referring MD:         [name] (Referring MD) Medicines:            Propofol per Anesthesia Complications:        No immediate complications. Procedure:            Pre-Anesthesia Assessment:                       - After reviewing the risks and benefits, the patient                        was deemed in satisfactory condition to undergo the                        procedure.                       After obtaining informed consent, the colonoscope was                        passed under direct vision. Throughout the procedure,                        the patient's blood pressure, pulse, and oxygen                        saturations were monitored continuously. The                        Colonoscope was introduced through the anus and                        advanced to the the cecum, identified by appendiceal                        orifice and ileocecal valve. The colonoscopy was                        performed without difficulty. The patient tolerated the                        procedure well. The quality of the bowel preparation                        was adequate to identify polyps. Findings:      A diminutive polyp was found in the descending colon. The polyp was       sessile. The polyp was removed with a cold snare. Resection and       retrieval were complete.      A small polyp was found in the sigmoid colon. The polyp was sessile. The       polyp was removed with a hot snare. Resection and retrieval were  complete.      Internal hemorrhoids were found during  endoscopy. The hemorrhoids were       small and Grade I (internal hemorrhoids that do not prolapse).      The exam was otherwise without abnormality. Impression:           - One diminutive polyp in the descending colon, removed                        with a cold snare. Resected and retrieved.                       - One small polyp in the sigmoid colon, removed with a                        hot snare. Resected and retrieved.                       - Internal hemorrhoids.                       - The examination was otherwise normal. Recommendation:       - Await pathology results. [name], MD 09/15/2015 3:53:41 PM This report has been signed electronically. Number of Addenda: 0 Note Initiated On: 09/15/2015 3:24 PM Scope Withdrawal Time: 0 hours 15 minutes 36 seconds  Total Procedure Duration: 0 hours 21 minutes 2 seconds       [facility]

## 2015-09-16 ENCOUNTER — Telehealth: Payer: Self-pay | Admitting: Urology

## 2015-09-16 ENCOUNTER — Encounter: Payer: Self-pay | Admitting: Unknown Physician Specialty

## 2015-09-16 DIAGNOSIS — R300 Dysuria: Secondary | ICD-10-CM

## 2015-09-16 NOTE — Telephone Encounter (Signed)
I will would like to get a urine culture and GC and chlamydia cultures performed on this patient. They were not done on his visit on 09/11/2015. Please have the patient provide a urine specimen so that we may run these cultures.

## 2015-09-16 NOTE — H&P (Signed)
Primary Care Physician:  [facility], MD Primary Gastroenterologist:  Dr. Vira Agar  Pre-Procedure History & Physical: HPI:  Jason Tran is a 50 y.o. male is here for an colonoscopy.   Past Medical History  Diagnosis Date  . Sleep apnea   . HTN (hypertension)   . BPH (benign prostatic hyperplasia)   . Chronic prostatitis   . Elevated PSA   . BP (high blood pressure) 11/21/2014  . Allergic rhinitis, seasonal 12/26/2013  . 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 Prostatitis 10/21/2014  . Gout   . Renal disease   . 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 Disorder of kidney 11/21/2014    Past Surgical History  Procedure Laterality Date  . Cystoscopy    . Colonoscopy with propofol N/A 09/15/2015    Procedure: COLONOSCOPY WITH PROPOFOL;  Surgeon: [name], MD;  Location: [facility] ENDOSCOPY;  Service: Endoscopy;  Laterality: N/A;    Prior to Admission medications   Medication Sig Start Date End Date Taking? Authorizing Provider  eplerenone (INSPRA) 50 MG tablet Reported on 08/27/2015 09/29/14  Yes Historical Provider, MD  metoprolol (TOPROL-XL) 200 MG 24 hr tablet  09/29/14  Yes Historical Provider, MD  tamsulosin (FLOMAX) 0.4 MG CAPS capsule  10/04/14  Yes Historical Provider, MD  Jabier Gauss 40-10-25 MG TABS  09/29/14  Yes Historical Provider, MD  allopurinol (ZYLOPRIM) 100 MG tablet Take by mouth. 09/03/15 09/02/16  Historical Provider, MD  Cyanocobalamin (RA VITAMIN B-12 TR) 1000 MCG TBCR Take by mouth. 12/26/14 12/26/15  Historical Provider, MD  EPINEPHrine 0.3 mg/0.3 mL IJ SOAJ injection Reported on 08/27/2015 07/25/15   Historical Provider, MD  fluticasone Asencion Islam) 50 MCG/ACT nasal spray  10/04/14   Historical Provider, MD  furosemide (LASIX) 20 MG tablet  09/21/14   Historical Provider, MD  indomethacin (INDOCIN) 50 MG capsule Reported on 09/11/2015 05/09/15   Historical  Provider, MD  loratadine (CLARITIN) 10 MG tablet  09/28/14   Historical Provider, MD  Vitamin D, Ergocalciferol, (DRISDOL) 50000 UNITS CAPS capsule  10/03/14   Historical Provider, MD    Allergies as of 08/27/2015  . (No Known Allergies)    Family History  Problem Relation Age of Onset  . Benign prostatic hyperplasia    . Hypertension    . Kidney disease Mother   . Kidney disease Maternal Uncle   . Prostate cancer Neg Hx     Social History   Social History  . Marital Status: Married    Spouse Name: N/A  . Number of Children: N/A  . Years of Education: N/A   Occupational History  . Not on file.   Social History Main Topics  . Smoking status: Former Smoker    Quit date: 11/21/2006  . Smokeless tobacco: Not on file  . Alcohol Use: 0.0 oz/week    0 Standard drinks or equivalent per week  . Drug Use: No  . Sexual Activity: Not on file   Other Topics Concern  . Not on file   Social History Narrative    Review of Systems: See HPI, otherwise negative ROS  Physical Exam: BP 142/93 mmHg  Pulse 40  Temp(Src) 96.7 F (35.9 C) (Tympanic)  Resp 13  Wt 137.44 kg (303 lb)  SpO2 96% General:   Alert,  pleasant and cooperative in NAD Head:  Normocephalic and atraumatic. Neck:  Supple; no masses or thyromegaly.  Lungs:  Clear throughout to auscultation.    Heart:  Regular rate and rhythm. Abdomen:  Soft, nontender and nondistended. Normal bowel sounds, without guarding, and without rebound.   Neurologic:  Alert and  oriented x4;  grossly normal neurologically.  Impression/Plan: Jason Tran is here for an colonoscopy to be performed for screening  Risks, benefits, limitations, and alternatives regarding  colonoscopy have been reviewed with the patient.  Questions have been answered.  All parties agreeable.   Gaylyn Cheers, MD  09/16/2015, 1:25 PM

## 2015-09-17 ENCOUNTER — Other Ambulatory Visit: Payer: Self-pay

## 2015-09-17 LAB — SURGICAL PATHOLOGY

## 2015-09-17 NOTE — Telephone Encounter (Signed)
Spoke with pt in reference to needing a urine for cultures. Pt voiced understanding. Pt will RTC today for a urine. Orders placed.

## 2015-09-18 ENCOUNTER — Other Ambulatory Visit: Payer: PRIVATE HEALTH INSURANCE

## 2015-09-18 DIAGNOSIS — R300 Dysuria: Secondary | ICD-10-CM

## 2015-09-20 LAB — GC/CHLAMYDIA PROBE AMP
Chlamydia trachomatis, NAA: NEGATIVE
NEISSERIA GONORRHOEAE BY PCR: NEGATIVE

## 2015-09-20 LAB — CULTURE, URINE COMPREHENSIVE

## 2015-09-29 ENCOUNTER — Telehealth: Payer: Self-pay

## 2015-09-29 NOTE — Telephone Encounter (Signed)
-----   Message from Nori Riis, PA-C sent at 09/21/2015  5:58 PM EDT ----- Patient's cultures are negative. He will need to be scheduled for a cystoscopy.

## 2015-09-29 NOTE — Telephone Encounter (Signed)
Spoke with pt in reference to ucx results. Pt stated that he has been out of work a lot recently and will call back when he is able to make appt.

## 2016-07-29 ENCOUNTER — Other Ambulatory Visit: Payer: Self-pay

## 2016-08-05 ENCOUNTER — Ambulatory Visit: Payer: Self-pay | Admitting: Urology

## 2016-09-01 ENCOUNTER — Other Ambulatory Visit: Payer: PRIVATE HEALTH INSURANCE

## 2016-09-01 DIAGNOSIS — N400 Enlarged prostate without lower urinary tract symptoms: Secondary | ICD-10-CM

## 2016-09-02 LAB — PSA: Prostate Specific Ag, Serum: 0.5 ng/mL (ref 0.0–4.0)

## 2016-09-07 NOTE — Progress Notes (Signed)
8:46 AM   Ladona Mow Feb 10, 1966 [id number]  Referring provider: [name], MD [place], [place] [address]  Chief Complaint  Patient presents with  . Dysuria    1 year follow up  . Hematuria    HPI: Patient is a 51 year old African-American male who presents today for a one year follow up for history of hematuria and BPH with LU TS.       History of hematuria Patient was referred from their PCP, Dr Donetta Potts, for gross hematuria.  Patient stated he had 2 episodes of gross hematuria since his last visit with [place] one month ago.  He does have a history of chronic prostatitis and BPH.  He does not have a prior history of recurrent urinary tract infections, nephrolithiasis, trauma to the genitourinary tract or malignancies of the genitourinary tract.  He does not have a family medical history of nephrolithiasis, malignancies of the genitourinary tract or hematuria.  He is not experiencing any suprapubic pain, abdominal pain or flank pain. He denies any recent fevers, chills, nausea or vomiting.  He has not had any recent imaging studies. He did undergo cystoscopic examination on 08/02/2014 with [name] and was found to have mild inflammation of the prostate.   He is a former smoker, with a 1 ppd history.  Quit 8 years ago.  He is not exposed to secondhand smoke.  He has worked with [employer].  He underwent a CTU 09/11/2015 and it noted no renal, ureteral or bladder calculi or mass. Multiple benign-appearing renal cysts. Bilateral adrenal gland adenomas. No acute abdominal/pelvic findings, mass lesions or adenopathy.  He did not complete the cystoscopy due to issues at his job.  He did not reschedule.  Today, he is not experiencing dysuria, gross hematuria or suprapubic pain. He denies fevers, chills, nausea and vomiting.    His UA today demonstrates no hematuria.      His I PSS score today is 12, which is moderate LU TS.   He is mixed with his LU TS.   His PVR  is 25 mL.  He is currently on tamsulosin 0.4 mg daily. His last PVR was 207 mL.  His last IPSS score 19/5.  His major complaints today is a post void dribbling and feelings of incomplete emptying.   His PSA is 0.5.        IPSS    Row Name 09/08/16 0800         International Prostate Symptom Score   How often have you had the sensation of not emptying your bladder? Not at All     How often have you had to urinate less than every two hours? About half the time     How often have you found you stopped and started again several times when you urinated? Less than half the time     How often have you found it difficult to postpone urination? About half the time     How often have you had a weak urinary stream? Less than half the time     How often have you had to strain to start urination? Less than half the time     How many times did you typically get up at night to urinate? None     Total IPSS Score 12       Quality of Life due to urinary symptoms   If you were to spend the rest of your life with your urinary  condition just the way it is now how would you feel about that? Mixed        Score:  1-7 Mild 8-19 Moderate 20-35 Severe   PMH: Past Medical History:  Diagnosis Date  . 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 Allergic rhinitis, seasonal 12/26/2013  . 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 BP (high blood pressure) 11/21/2014  . BPH (benign prostatic hyperplasia)   . Chronic prostatitis   . 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 Disorder of kidney 11/21/2014  . Elevated PSA   . Gout   . HTN (hypertension)   . Prostatitis 10/21/2014  . Renal disease   . Sleep apnea     Surgical History: Past Surgical History:  Procedure Laterality Date  . COLONOSCOPY WITH PROPOFOL N/A 09/15/2015   Procedure: COLONOSCOPY WITH PROPOFOL;  Surgeon: [name], MD;  Location: [facility] ENDOSCOPY;  Service:  Endoscopy;  Laterality: N/A;  . CYSTOSCOPY      Home Medications:  Allergies as of 09/08/2016   No Known Allergies     Medication List       Accurate as of 09/08/16  8:46 AM. Always use your most recent med list.          allopurinol 100 MG tablet Commonly known as:  ZYLOPRIM Take by mouth.   allopurinol 100 MG tablet Commonly known as:  ZYLOPRIM take 1 tablet by mouth once daily   EPINEPHrine 0.3 mg/0.3 mL Soaj injection Commonly known as:  EPI-PEN Reported on 08/27/2015   eplerenone 50 MG tablet Commonly known as:  INSPRA Reported on 08/27/2015   eplerenone 50 MG tablet Commonly known as:  INSPRA Take by mouth.   fluticasone 50 MCG/ACT nasal spray Commonly known as:  FLONASE   furosemide 20 MG tablet Commonly known as:  LASIX   indomethacin 50 MG capsule Commonly known as:  INDOCIN Reported on 09/11/2015   loratadine 10 MG tablet Commonly known as:  CLARITIN   metoprolol 200 MG 24 hr tablet Commonly known as:  TOPROL-XL Take 200 mg by mouth daily.   tamsulosin 0.4 MG Caps capsule Commonly known as:  FLOMAX   TRIBENZOR 40-10-25 MG Tabs Generic drug:  Olmesartan-Amlodipine-HCTZ   vitamin B-12 1000 MCG tablet Commonly known as:  CYANOCOBALAMIN take 1 tablet by mouth once daily   Vitamin D (Ergocalciferol) 50000 units Caps capsule Commonly known as:  DRISDOL       Allergies: No Known Allergies  Family History: Family History  Problem Relation Age of Onset  . Benign prostatic hyperplasia Unknown   . Hypertension Unknown   . Kidney disease Mother   . Kidney disease Maternal Uncle   . Prostate cancer Neg Hx   . Kidney cancer Neg Hx   . Bladder Cancer Neg Hx     Social History:  reports that he quit smoking about 9 years ago. He has never used smokeless tobacco. He reports that he drinks alcohol. He reports that he does not use drugs.  ROS: UROLOGY Frequent Urination?: No Hard to postpone urination?: No Burning/pain with urination?: No Get  up at night to urinate?: No Leakage of urine?: No Urine stream starts and stops?: No Trouble starting stream?: No Do you have to strain to urinate?: No Blood in urine?: No Urinary tract infection?: No Sexually transmitted disease?: No Injury to kidneys or bladder?: No Painful intercourse?: No Weak stream?: No Erection problems?: No Penile pain?:  No  Gastrointestinal Nausea?: No Vomiting?: No Indigestion/heartburn?: No Diarrhea?: No Constipation?: No  Constitutional Fever: No Night sweats?: No Weight loss?: No Fatigue?: No  Skin Skin rash/lesions?: No Itching?: No  Eyes Blurred vision?: No Double vision?: No  Ears/Nose/Throat Sore throat?: No Sinus problems?: No  Hematologic/Lymphatic Swollen glands?: No Easy bruising?: No  Cardiovascular Leg swelling?: No Chest pain?: No  Respiratory Cough?: No Shortness of breath?: No  Endocrine Excessive thirst?: No  Musculoskeletal Back pain?: No Joint pain?: No  Neurological Headaches?: No Dizziness?: No  Psychologic Depression?: No Anxiety?: No  Physical Exam: BP (!) 172/108   Pulse (!) 43   Ht 6\' 2"  (1.88 m)   Wt (!) 302 lb (137 kg)   BMI 38.77 kg/m   Constitutional: Well nourished. Alert and oriented, No acute distress. HEENT: [place] AT, moist mucus membranes. Trachea midline, no masses. Cardiovascular: No clubbing, cyanosis, or edema. Respiratory: Normal respiratory effort, no increased work of breathing. GI: Abdomen is soft, non tender, non distended, no abdominal masses. Liver and spleen not palpable.  No hernias appreciated.  Stool sample for occult testing is not indicated.   GU: No CVA tenderness.  No bladder fullness or masses.  Patient with circumcised phallus.  Urethral meatus is patent.  No penile discharge. No penile lesions or rashes. Scrotum without lesions, cysts, rashes and/or edema.  Testicles are located scrotally bilaterally. No masses are appreciated in the testicles. Left and right  epididymis are normal. Rectal: Patient with  normal sphincter tone. Anus and perineum without scarring or rashes. No rectal masses are appreciated. Prostate is approximately 45 grams, no nodules are appreciated. Seminal vesicles are normal. Skin: No rashes, bruises or suspicious lesions. Lymph: No cervical or inguinal adenopathy. Neurologic: Grossly intact, no focal deficits, moving all 4 extremities. Psychiatric: Normal mood and affect.  Laboratory Data: Lab Results  Component Value Date   WBC 4.7 10/27/2011   HGB 16.4 10/27/2011   HCT 47.9 10/27/2011   MCV 89 10/27/2011   PLT 171 10/27/2011    PSA history  0.4 ng/mL on 07/02/2014  0.4 ng/mL on 08/06/2015   0.5 ng/mL on 09/01/2016  Results for orders placed or performed in visit on 09/08/16  BLADDER SCAN AMB NON-IMAGING  Result Value Ref Range   Scan Result 25    Urinalysis Unremarkable.  See EPIC.    Pertinent Imaging: CLINICAL DATA: Episode of gross hematuria 2 weeks ago.  EXAM: CT ABDOMEN AND PELVIS WITHOUT AND WITH CONTRAST  TECHNIQUE: Multidetector CT imaging of the abdomen and pelvis was performed following the standard protocol before and following the bolus administration of intravenous contrast.  CONTRAST: 162mL ISOVUE-300 IOPAMIDOL (ISOVUE-300) INJECTION 61%  COMPARISON: None.  FINDINGS: Lower chest: The lung bases are clear of acute process. No pleural effusion or pulmonary lesions. The heart is normal in size. No pericardial effusion. The distal esophagus and aorta are unremarkable.  Hepatobiliary: No focal hepatic lesions or intrahepatic biliary dilatation. The gallbladder is normal. No common bile duct dilatation.  Pancreas: No mass, inflammation or ductal dilatation.  Spleen: Normal size. No focal lesions.  Adrenals/Urinary Tract: There are bilateral adrenal gland lesions. The left adrenal gland lesion measures 3.5 cm but is less than 10 Hounsfield units on the precontrast  images consistent with a benign adenoma. The right adrenal gland lesion measures 2.6 cm and also measures less than 10 Hounsfield units, consistent with benign adenoma.  There are multiple large left renal cysts and a moderate-sized right-sided parapelvic renal cysts. No worrisome renal  lesions. No renal, ureteral or bladder calculi. No collecting system abnormality on the delayed images. Both kidneys demonstrate normal enhancement/ perfusion. Both ureters are normal. The bladder is normal. No bladder mass or bladder thickening. The prostate gland and seminal vesicles are unremarkable.  Stomach/Bowel: The stomach, duodenum, small bowel and colon are grossly normal without oral contrast. No inflammatory changes, mass lesions or obstructive findings. The terminal ileum is normal. The appendix is normal.  Vascular/Lymphatic: No mesenteric or retroperitoneal mass or adenopathy. Small scattered lymph nodes are noted. Scattered aortic and iliac artery calcifications but no aneurysm or dissection. The branch vessels are patent. The major venous structures are patent.  Other: No pelvic mass or adenopathy. No free pelvic fluid collections. No inguinal mass or adenopathy.  Musculoskeletal: No significant bony findings. Sclerotic lesion in the left acetabulum is likely a benign bone island.  IMPRESSION: 1. No renal, ureteral or bladder calculi or mass. Multiple benign appearing renal cysts. 2. Bilateral adrenal gland adenomas. 3. No acute abdominal/pelvic findings, mass lesions or adenopathy.   Electronically Signed  By: Marijo Sanes M.D.  On: 09/11/2015 09:42   Assessment & Plan:    1. History of hematuria  - completed CTU in 2017  - no recent gross hematuria  - Urinalysis, Complete  - does not want pursue a cystoscopy at this time  2. BPH with LUTS  - IPSS score is 12/3, it is worsening  - Continue conservative management, avoiding bladder irritants and timed  voiding's  - most bothersome symptoms is/are post void dribbling  - Continue tamsulosin 0.4 mg daily;  refills given  - RTC in 12 months for IPSS, PSA and exam      Return in about 1 year (around 09/08/2017) for UA, PSA and I PSS.  These notes generated with voice recognition software. I apologize for typographical errors.  [name], [facility] [address] [place], [place] [address] [phone]

## 2016-09-08 ENCOUNTER — Ambulatory Visit (INDEPENDENT_AMBULATORY_CARE_PROVIDER_SITE_OTHER): Payer: PRIVATE HEALTH INSURANCE | Admitting: Urology

## 2016-09-08 ENCOUNTER — Encounter: Payer: Self-pay | Admitting: Urology

## 2016-09-08 VITALS — BP 172/108 | HR 43 | Ht 74.0 in | Wt 302.0 lb

## 2016-09-08 DIAGNOSIS — R310 Gross hematuria: Secondary | ICD-10-CM

## 2016-09-08 DIAGNOSIS — R350 Frequency of micturition: Secondary | ICD-10-CM | POA: Diagnosis not present

## 2016-09-08 DIAGNOSIS — Z87448 Personal history of other diseases of urinary system: Secondary | ICD-10-CM

## 2016-09-08 LAB — URINALYSIS, COMPLETE
BILIRUBIN UA: NEGATIVE
Glucose, UA: NEGATIVE
Ketones, UA: NEGATIVE
Leukocytes, UA: NEGATIVE
NITRITE UA: NEGATIVE
PH UA: 6 (ref 5.0–7.5)
Protein, UA: NEGATIVE
RBC UA: NEGATIVE
SPEC GRAV UA: 1.01 (ref 1.005–1.030)
UUROB: 0.2 mg/dL (ref 0.2–1.0)

## 2016-09-08 LAB — BLADDER SCAN AMB NON-IMAGING: SCAN RESULT: 25

## 2017-06-08 IMAGING — CT CT ABD-PEL WO/W CM
1 of 4 series · 8 of 32 positions shown, 13 images · IV contrast (APPLIED)
Comparison: None.

CLINICAL DATA: Episode of gross hematuria 2 weeks ago.

EXAM:
CT ABDOMEN AND PELVIS WITHOUT AND WITH CONTRAST
TECHNIQUE: Multidetector CT imaging of the abdomen and pelvis was performed
following the standard protocol before and following the bolus
administration of intravenous contrast.
CONTRAST:  125mL FK00AD-LII IOPAMIDOL (FK00AD-LII) INJECTION 61%

[Series 12: axial delay · axial · delayed · 0.85mm/px · z∈[-1010,-590]mm · 8 of 110 slices shown, 13 images]
[im 13/110  soft-tissue]
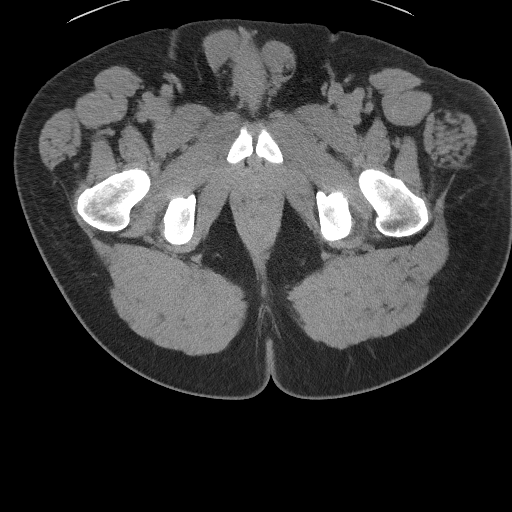
[im 13/110  bone]
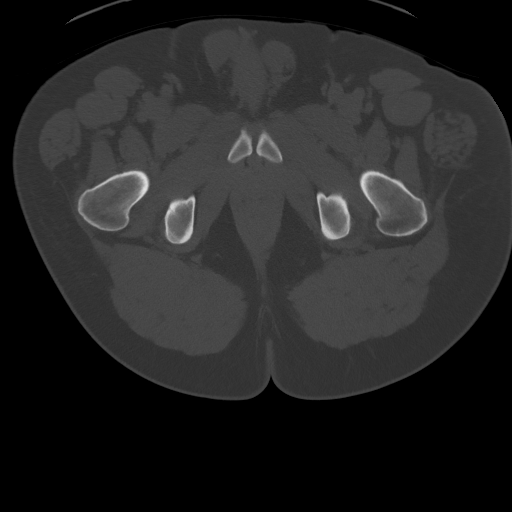
[im 25/110  soft-tissue]
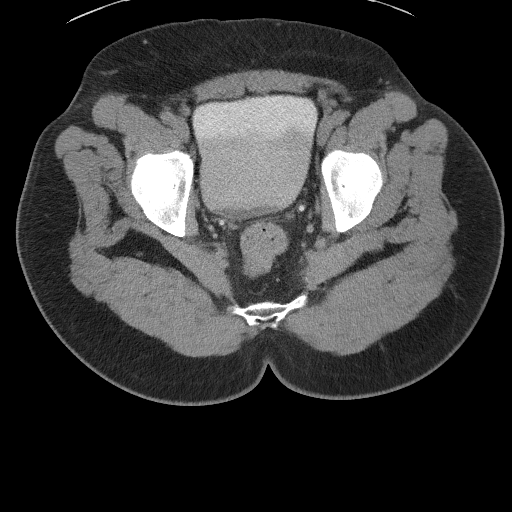
[im 37/110  soft-tissue]
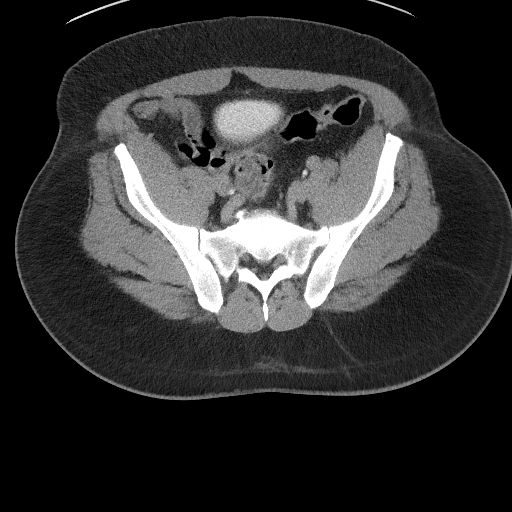
[im 49/110  soft-tissue]
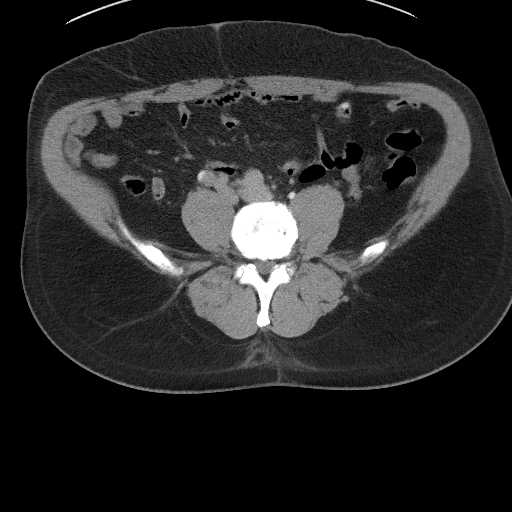
[im 61/110  soft-tissue]
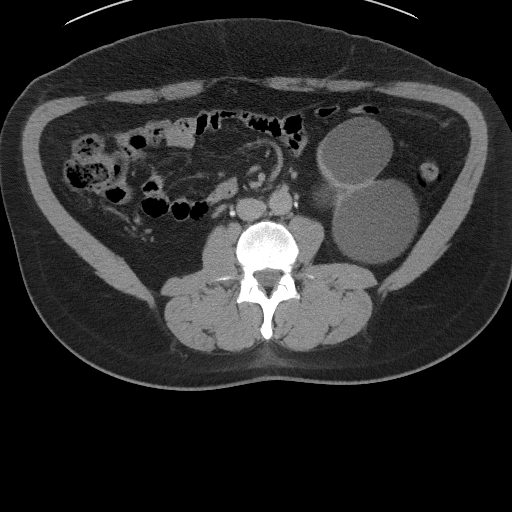
[im 61/110  lung]
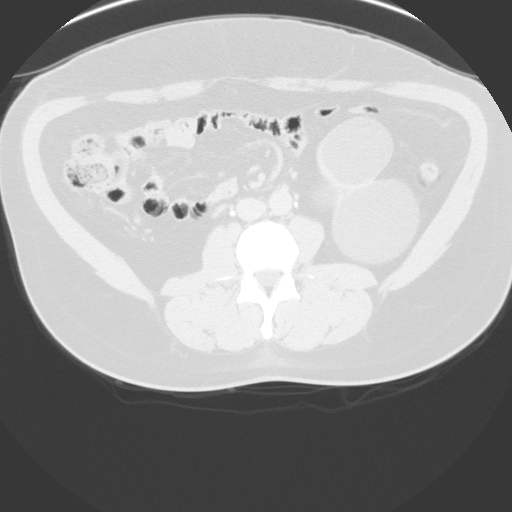
[im 73/110  soft-tissue]
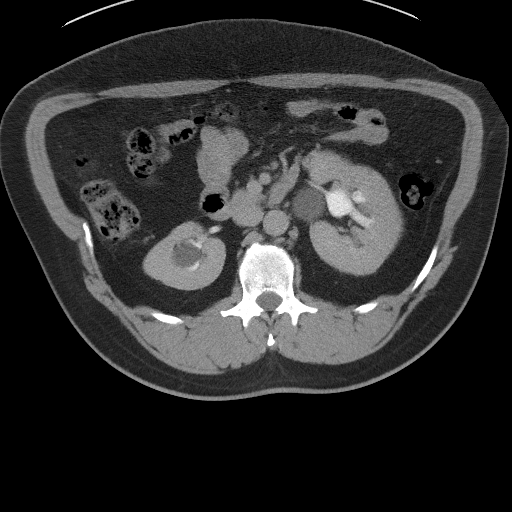
[im 73/110  lung]
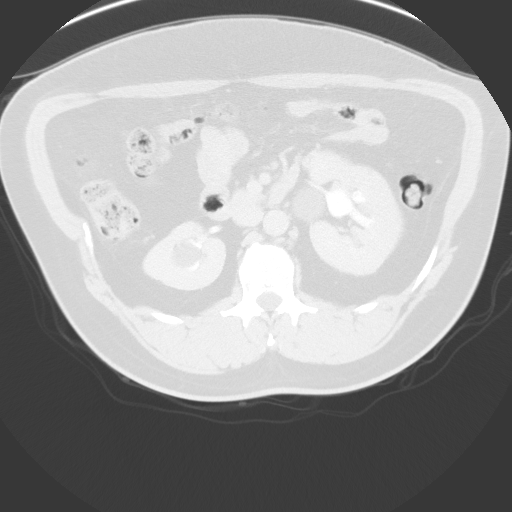
[im 85/110  soft-tissue]
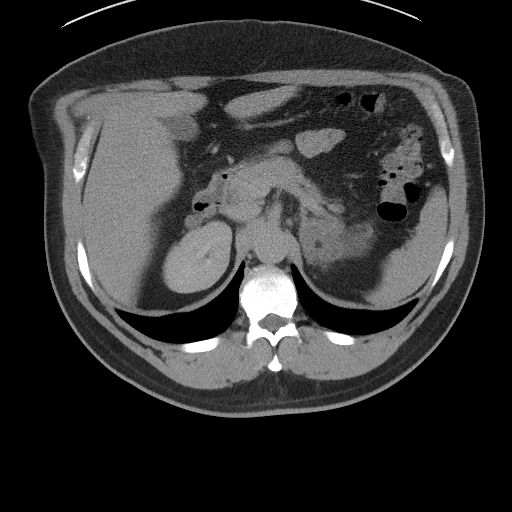
[im 85/110  lung]
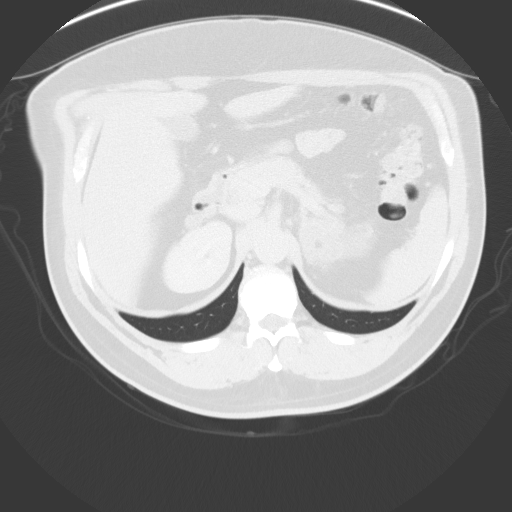
[im 97/110  soft-tissue]
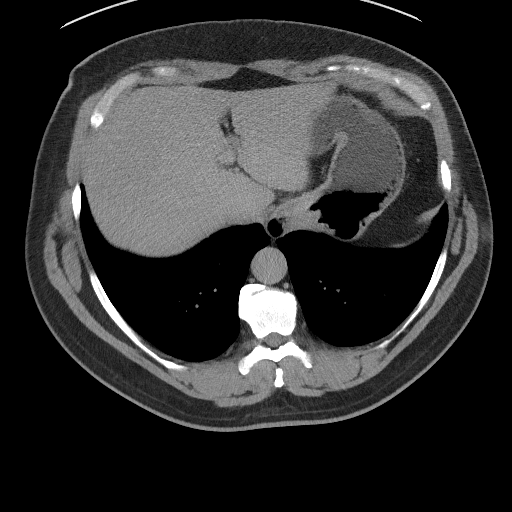
[im 97/110  lung]
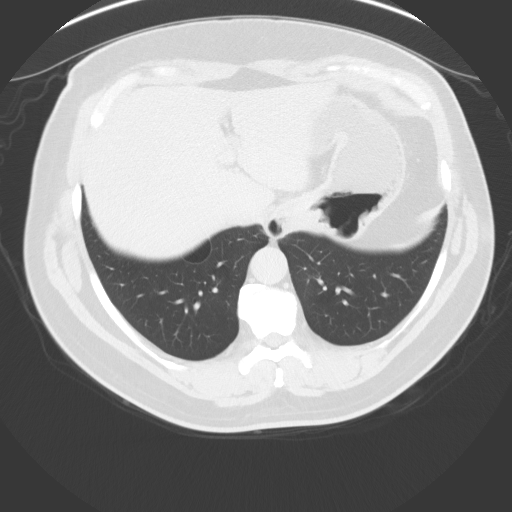

[8 of 32 positions shown; findings below may reference images not displayed]

FINDINGS: Lower chest: The lung bases are clear of acute process. No pleural
effusion or pulmonary lesions. The heart is normal in size. No
pericardial effusion. The distal esophagus and aorta are
unremarkable.

Hepatobiliary: No focal hepatic lesions or intrahepatic biliary
dilatation. The gallbladder is normal. No common bile duct
dilatation.

Pancreas: No mass, inflammation or ductal dilatation.

Spleen: Normal size.  No focal lesions.

Adrenals/Urinary Tract: There are bilateral adrenal gland lesions.
The left adrenal gland lesion measures 3.5 cm but is less than 10
Hounsfield units on the precontrast images consistent with a benign
adenoma. The right adrenal gland lesion measures 2.6 cm and also
measures less than 10 Hounsfield units, consistent with benign
adenoma.

There are multiple large left renal cysts and a moderate-sized
right-sided parapelvic renal cysts. No worrisome renal lesions. No
renal, ureteral or bladder calculi. No collecting system abnormality
on the delayed images. Both kidneys demonstrate normal enhancement/
perfusion. Both ureters are normal. The bladder is normal. No
bladder mass or bladder thickening. The prostate gland and seminal
vesicles are unremarkable.

Stomach/Bowel: The stomach, duodenum, small bowel and colon are
grossly normal without oral contrast. No inflammatory changes, mass
lesions or obstructive findings. The terminal ileum is normal. The
appendix is normal.

Vascular/Lymphatic: No mesenteric or retroperitoneal mass or
adenopathy. Small scattered lymph nodes are noted. Scattered aortic
and iliac artery calcifications but no aneurysm or dissection. The
branch vessels are patent. The major venous structures are patent.

Other: No pelvic mass or adenopathy. No free pelvic fluid
collections. No inguinal mass or adenopathy.

Musculoskeletal: No significant bony findings. Sclerotic lesion in
the left acetabulum is likely a benign bone island.
IMPRESSION: 1. No renal, ureteral or bladder calculi or mass. Multiple benign
appearing renal cysts.
2. Bilateral adrenal gland adenomas.
3. No acute abdominal/pelvic findings, mass lesions or adenopathy.

## 2017-09-05 ENCOUNTER — Other Ambulatory Visit: Payer: Self-pay

## 2017-09-05 ENCOUNTER — Other Ambulatory Visit: Payer: PRIVATE HEALTH INSURANCE

## 2017-09-05 DIAGNOSIS — N400 Enlarged prostate without lower urinary tract symptoms: Secondary | ICD-10-CM

## 2017-09-06 LAB — PSA: PROSTATE SPECIFIC AG, SERUM: 0.5 ng/mL (ref 0.0–4.0)

## 2017-09-08 ENCOUNTER — Ambulatory Visit: Payer: PRIVATE HEALTH INSURANCE | Admitting: Urology

## 2017-09-13 ENCOUNTER — Ambulatory Visit: Payer: PRIVATE HEALTH INSURANCE | Admitting: Urology

## 2017-09-18 NOTE — Progress Notes (Signed)
9:10 AM   Ladona Mow 1965/05/03 [id number]  Referring provider: [name], MD [place], [place] [address]  Chief Complaint  Patient presents with  . Hematuria    1year follow up    HPI: Patient is a 52 year old African-American male who presents today for a one year follow up for history of hematuria and BPH with LU TS.       History of hematuria Cystoscopy with [name] in 07/2014 noted mild inflammation of the prostate.  CTU in 09/11/2015 noted no renal, ureteral or bladder calculi or mass. Multiple benign appearing renal cysts.  Bilateral adrenal gland adenomas.  No acute abdominal/pelvic findings, mass lesions or adenopathy.  He is a former smoker, with a 1 ppd history.  Quit 9 years ago.   He denies any gross hematuria.  His UA today was negative.    BPH with LUTS His I PSS score today is 16, which is moderate LU TS.   He is mostly dissatisfied with his LU TS.   His previous I PSS score was 12/3.  His previous PVR was 25 mL.  He is on tamsulosin 0.4 mg daily.  His major complaints today is a post void dribbling and feelings of incomplete emptying.    Patient denies any gross hematuria, dysuria or suprapubic/flank pain.  Patient denies any fevers, chills, nausea or vomiting.  IPSS    Row Name 09/23/17 0800         International Prostate Symptom Score   How often have you had the sensation of not emptying your bladder?  Less than half the time     How often have you had to urinate less than every two hours?  More than half the time     How often have you found you stopped and started again several times when you urinated?  About half the time     How often have you found it difficult to postpone urination?  Less than 1 in 5 times     How often have you had a weak urinary stream?  Less than half the time     How often have you had to strain to start urination?  Less than half the time     How many times did you typically get up at night to urinate?  2  Times     Total IPSS Score  16       Quality of Life due to urinary symptoms   If you were to spend the rest of your life with your urinary condition just the way it is now how would you feel about that?  Mostly Disatisfied        Score:  1-7 Mild 8-19 Moderate 20-35 Severe   PMH: Past Medical History:  Diagnosis Date  . 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 Allergic rhinitis, seasonal 12/26/2013  . 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 BP (high blood pressure) 11/21/2014  . BPH (benign prostatic hyperplasia)   . Chronic prostatitis   . 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 Disorder of kidney 11/21/2014  . Elevated PSA   . Gout   . HTN (hypertension)   . Prostatitis 10/21/2014  . Renal disease   . Sleep apnea     Surgical History: Past Surgical History:  Procedure Laterality Date  . COLONOSCOPY WITH PROPOFOL N/A 09/15/2015   Procedure: COLONOSCOPY WITH  PROPOFOL;  Surgeon: [name], MD;  Location: [facility] ENDOSCOPY;  Service: Endoscopy;  Laterality: N/A;  . CYSTOSCOPY      Home Medications:  Allergies as of 09/23/2017   No Known Allergies     Medication List        Accurate as of 09/23/17  9:10 AM. Always use your most recent med list.          allopurinol 100 MG tablet Commonly known as:  ZYLOPRIM take 1 tablet by mouth once daily   eplerenone 50 MG tablet Commonly known as:  INSPRA Take by mouth.   furosemide 20 MG tablet Commonly known as:  LASIX   loratadine 10 MG tablet Commonly known as:  CLARITIN   metoprolol 200 MG 24 hr tablet Commonly known as:  TOPROL-XL Take 200 mg by mouth daily.   tamsulosin 0.4 MG Caps capsule Commonly known as:  FLOMAX   TRIBENZOR 40-10-25 MG Tabs Generic drug:  Olmesartan-amLODIPine-HCTZ   vitamin B-12 1000 MCG tablet Commonly known as:  CYANOCOBALAMIN take 1 tablet by mouth once daily   Vitamin D (Ergocalciferol) 50000 units  Caps capsule Commonly known as:  DRISDOL       Allergies: No Known Allergies  Family History: Family History  Problem Relation Age of Onset  . Benign prostatic hyperplasia Unknown   . Hypertension Unknown   . Kidney disease Mother   . Kidney disease Maternal Uncle   . Prostate cancer Neg Hx   . Kidney cancer Neg Hx   . Bladder Cancer Neg Hx     Social History:  reports that he quit smoking about 10 years ago. He has never used smokeless tobacco. He reports that he drinks alcohol. He reports that he does not use drugs.  ROS: UROLOGY Frequent Urination?: No Hard to postpone urination?: No Burning/pain with urination?: No Get up at night to urinate?: No Leakage of urine?: Yes Urine stream starts and stops?: No Trouble starting stream?: No Do you have to strain to urinate?: No Blood in urine?: No Urinary tract infection?: No Sexually transmitted disease?: No Injury to kidneys or bladder?: No Painful intercourse?: No Weak stream?: No Erection problems?: No Penile pain?: No  Gastrointestinal Nausea?: No Vomiting?: No Indigestion/heartburn?: No Diarrhea?: No Constipation?: No  Constitutional Fever: No Night sweats?: No Weight loss?: No Fatigue?: No  Skin Skin rash/lesions?: No Itching?: No  Eyes Blurred vision?: No Double vision?: No  Ears/Nose/Throat Sore throat?: No Sinus problems?: No  Hematologic/Lymphatic Swollen glands?: No Easy bruising?: No  Cardiovascular Leg swelling?: No Chest pain?: No  Respiratory Cough?: No Shortness of breath?: No  Endocrine Excessive thirst?: No  Musculoskeletal Back pain?: No Joint pain?: No  Neurological Headaches?: No Dizziness?: No  Psychologic Depression?: No Anxiety?: No  Physical Exam: BP (!) 148/96   Pulse (!) 45   Ht 6\' 3"  (1.905 m)   Wt 297 lb (134.7 kg)   BMI 37.12 kg/m   Constitutional: Well nourished. Alert and oriented, No acute distress. HEENT: [place] AT, moist mucus membranes.  Trachea midline, no masses. Cardiovascular: No clubbing, cyanosis, or edema. Respiratory: Normal respiratory effort, no increased work of breathing. GI: Abdomen is soft, non tender, non distended, no abdominal masses. Liver and spleen not palpable.  No hernias appreciated.  Stool sample for occult testing is not indicated.   GU: No CVA tenderness.  No bladder fullness or masses.  Patient with uncircumcised phallus.  Foreskin easily retracted Urethral meatus is patent.  No penile discharge. No penile lesions  or rashes. Scrotum without lesions, cysts, rashes and/or edema.  Testicles are located scrotally bilaterally. No masses are appreciated in the testicles. Left and right epididymis are normal. Rectal: Patient with  normal sphincter tone. Anus and perineum without scarring or rashes. No rectal masses are appreciated. Prostate is approximately 50 grams, could only palpated the apex and midportion of gland, no nodules are appreciated. Seminal vesicles are normal. Skin: No rashes, bruises or suspicious lesions. Lymph: No cervical or inguinal adenopathy. Neurologic: Grossly intact, no focal deficits, moving all 4 extremities. Psychiatric: Normal mood and affect.   Laboratory Data: Lab Results  Component Value Date   WBC 4.7 10/27/2011   HGB 16.4 10/27/2011   HCT 47.9 10/27/2011   MCV 89 10/27/2011   PLT 171 10/27/2011    PSA history  0.4 ng/mL on 07/02/2014  0.4 ng/mL on 08/06/2015   0.5 ng/mL on 09/01/2016  Results for orders placed or performed in visit on 09/05/17  PSA  Result Value Ref Range   Prostate Specific Ag, Serum 0.5 0.0 - 4.0 ng/mL   I have reviewed the labs.  Urinalysis Negative   See EPIC.     Assessment & Plan:    1. History of hematuria Completed CTU in 2017 - no malignancies found Patient refused follow up cystoscopy, but did undergo a cystoscopy in 2016 Urinalysis, Complete - negative for hematuria  2. Adrenal adenomas CT scan w/wo for surveillance  Will  call with results   3. BPH with LUTS  - IPSS score is 16/4, it is worsening  - Continue conservative management, avoiding bladder irritants and timed voiding's  - most bothersome symptoms is/are post void dribbling  - Continue tamsulosin 0.4 mg daily;  refills given  - RTC in 12 months for IPSS, PSA and exam      Return for pending urinalysis results .  These notes generated with voice recognition software. I apologize for typographical errors.  [name], PA-C  [facility] [address] [place], [place] [address] [phone]

## 2017-09-23 ENCOUNTER — Ambulatory Visit (INDEPENDENT_AMBULATORY_CARE_PROVIDER_SITE_OTHER): Payer: PRIVATE HEALTH INSURANCE | Admitting: Urology

## 2017-09-23 ENCOUNTER — Other Ambulatory Visit
Admission: RE | Admit: 2017-09-23 | Discharge: 2017-09-23 | Disposition: A | Discharge status: Home / Self Care | Payer: PRIVATE HEALTH INSURANCE | Source: Ambulatory Visit | Attending: Urology | Admitting: Urology

## 2017-09-23 ENCOUNTER — Other Ambulatory Visit: Payer: Self-pay

## 2017-09-23 ENCOUNTER — Encounter: Payer: Self-pay | Admitting: Urology

## 2017-09-23 VITALS — BP 148/96 | HR 45 | Ht 75.0 in | Wt 297.0 lb

## 2017-09-23 DIAGNOSIS — D3500 Benign neoplasm of unspecified adrenal gland: Secondary | ICD-10-CM

## 2017-09-23 DIAGNOSIS — N400 Enlarged prostate without lower urinary tract symptoms: Secondary | ICD-10-CM | POA: Diagnosis not present

## 2017-09-23 DIAGNOSIS — Z87448 Personal history of other diseases of urinary system: Secondary | ICD-10-CM

## 2017-09-23 LAB — URINALYSIS, COMPLETE (UACMP) WITH MICROSCOPIC
BACTERIA UA: NONE SEEN
Bilirubin Urine: NEGATIVE
Glucose, UA: NEGATIVE mg/dL
HGB URINE DIPSTICK: NEGATIVE
Ketones, ur: NEGATIVE mg/dL
Leukocytes, UA: NEGATIVE
Nitrite: NEGATIVE
Protein, ur: NEGATIVE mg/dL
RBC / HPF: NONE SEEN RBC/hpf (ref 0–5)
Specific Gravity, Urine: <1.005 — ABNORMAL LOW (ref 1.005–1.030)
pH: 5.5 (ref 5.0–8.0)

## 2017-09-26 ENCOUNTER — Telehealth: Payer: Self-pay

## 2017-09-26 NOTE — Telephone Encounter (Signed)
Pt informed

## 2017-09-26 NOTE — Telephone Encounter (Signed)
-----   Message from Nori Riis, PA-C sent at 09/23/2017  9:44 PM EDT ----- Please let Mr. Sentell know that there was no blood in his urine.  He still needs the CT scan of his adrenals.  They will call him to schedule the appointment.

## 2017-10-11 ENCOUNTER — Ambulatory Visit
Admission: RE | Admit: 2017-10-11 | Discharge: 2017-10-11 | Disposition: A | Discharge status: Home / Self Care | Payer: PRIVATE HEALTH INSURANCE | Source: Ambulatory Visit | Attending: Urology | Admitting: Urology

## 2017-10-11 DIAGNOSIS — D3502 Benign neoplasm of left adrenal gland: Secondary | ICD-10-CM | POA: Insufficient documentation

## 2017-10-11 DIAGNOSIS — D3501 Benign neoplasm of right adrenal gland: Secondary | ICD-10-CM | POA: Insufficient documentation

## 2017-10-11 DIAGNOSIS — D3500 Benign neoplasm of unspecified adrenal gland: Secondary | ICD-10-CM | POA: Diagnosis present

## 2017-10-12 ENCOUNTER — Telehealth: Payer: Self-pay

## 2017-10-12 NOTE — Telephone Encounter (Signed)
-----   Message from Nori Riis, PA-C sent at 10/12/2017  7:35 AM EDT ----- Please let Mr. Meriwether know that everything is stable on his CT scan.  Is he still seeing nephrology?

## 2017-10-12 NOTE — Telephone Encounter (Signed)
Jason Tran returned call.  I read message from [facility] and scheduled his appts for next year.

## 2017-10-12 NOTE — Telephone Encounter (Signed)
-----   Message from Nori Riis, PA-C sent at 10/12/2017  7:37 AM EDT ----- Mr. Sites also needs to schedule a one year follow up with me and will need UA and PSA prior to his appointment.

## 2017-10-12 NOTE — Telephone Encounter (Signed)
Left message for pt to call office

## 2017-10-12 NOTE — Telephone Encounter (Signed)
-----   Message from Nori Riis, PA-C sent at 10/12/2017  7:35 AM EDT -----  Please let Mr. Sontag know that everything is stable on his CT scan.  Is he still seeing nephrology? Mr. South also needs to schedule a one year follow up with me and will need UA and PSA prior to his appointment

## 2018-09-15 ENCOUNTER — Other Ambulatory Visit: Payer: Self-pay | Admitting: Family Medicine

## 2018-09-15 DIAGNOSIS — N400 Enlarged prostate without lower urinary tract symptoms: Secondary | ICD-10-CM

## 2018-09-18 ENCOUNTER — Other Ambulatory Visit: Payer: Self-pay

## 2018-09-18 ENCOUNTER — Other Ambulatory Visit: Payer: PRIVATE HEALTH INSURANCE

## 2018-09-18 DIAGNOSIS — N400 Enlarged prostate without lower urinary tract symptoms: Secondary | ICD-10-CM

## 2018-09-19 LAB — PSA: Prostate Specific Ag, Serum: 0.5 ng/mL (ref 0.0–4.0)

## 2018-09-19 NOTE — Progress Notes (Signed)
11:46 AM   Jason Tran March 18, 1966 [id number]  Referring provider: [name], MD [place],  [place] [address]  Chief Complaint  Patient presents with  . Hematuria    HPI: Patient is a 53 year old African-American male who presents today for a one year follow up for history of hematuria, bilateral adrenal adenomas and BPH with LU TS.       History of hematuria Cystoscopy with [name] in 07/2014 noted mild inflammation of the prostate.  CTU in 09/11/2015 noted no renal, ureteral or bladder calculi or mass. Multiple benign appearing renal cysts.  Bilateral adrenal gland adenomas.  No acute abdominal/pelvic findings, mass lesions or adenopathy.  He is a former smoker, with a 1 ppd history.  Quit 9 years ago.   He denies any gross hematuria.  His UA today was negative.    BPH WITH LUTS  (prostate and/or bladder) IPSS score: 17/3   PVR:  56 mL    Previous score: 16/4   Previous PVR: 25 mL  Major complaint(s): Nocturia x 1, occasionally for several years.  He also has the complaint of the feeling of urgency after he uses the restroom.  Denies any dysuria, hematuria or suprapubic pain.  Currently taking: Flomax 0.4 mg daily.  Denies any recent fevers, chills, nausea or vomiting.  He does not have a family history of PCa.  IPSS    Row Name 09/20/18 0900         International Prostate Symptom Score   How often have you had the sensation of not emptying your bladder?  About half the time     How often have you had to urinate less than every two hours?  About half the time     How often have you found you stopped and started again several times when you urinated?  More than half the time     How often have you found it difficult to postpone urination?  Not at All     How often have you had a weak urinary stream?  More than half the time     How often have you had to strain to start urination?  Less than half the time     How many times did you typically get up at  night to urinate?  1 Time     Total IPSS Score  17       Quality of Life due to urinary symptoms   If you were to spend the rest of your life with your urinary condition just the way it is now how would you feel about that?  Mixed        Score:  1-7 Mild 8-19 Moderate 20-35 Severe  Bilateral adrenal adenomas Stable on CT in 09/2017.    PMH: Past Medical History:  Diagnosis Date  . 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 Allergic rhinitis, seasonal 12/26/2013  . 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 BP (high blood pressure) 11/21/2014  . BPH (benign prostatic hyperplasia)   . Chronic prostatitis   . 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 Disorder of kidney 11/21/2014  . Elevated PSA   . Gout   . HTN (hypertension)   . Prostatitis 10/21/2014  . Renal disease   . Sleep apnea     Surgical History: Past Surgical History:  Procedure Laterality Date  . COLONOSCOPY WITH PROPOFOL N/A  09/15/2015   Procedure: COLONOSCOPY WITH PROPOFOL;  Surgeon: [name], MD;  Location: [facility] ENDOSCOPY;  Service: Endoscopy;  Laterality: N/A;  . CYSTOSCOPY      Home Medications:  Allergies as of 09/20/2018   No Known Allergies     Medication List       Accurate as of September 20, 2018 11:46 AM. If you have any questions, ask your nurse or doctor.        allopurinol 100 MG tablet Commonly known as: ZYLOPRIM take 1 tablet by mouth once daily   eplerenone 50 MG tablet Commonly known as: INSPRA Take by mouth.   fesoterodine 4 MG Tb24 tablet Commonly known as: TOVIAZ Take 1 tablet (4 mg total) by mouth daily. Started by: [name], PA-C   furosemide 20 MG tablet Commonly known as: LASIX   loratadine 10 MG tablet Commonly known as: CLARITIN   metoprolol 200 MG 24 hr tablet Commonly known as: TOPROL-XL Take 200 mg by mouth daily.   tamsulosin 0.4 MG Caps capsule Commonly known as: FLOMAX    Tribenzor 40-10-25 MG Tabs Generic drug: Olmesartan-amLODIPine-HCTZ   vitamin B-12 1000 MCG tablet Commonly known as: CYANOCOBALAMIN take 1 tablet by mouth once daily   Vitamin D (Ergocalciferol) 1.25 MG (50000 UT) Caps capsule Commonly known as: DRISDOL       Allergies: No Known Allergies  Family History: Family History  Problem Relation Age of Onset  . Benign prostatic hyperplasia Unknown   . Hypertension Unknown   . Kidney disease Mother   . Kidney disease Maternal Uncle   . Prostate cancer Neg Hx   . Kidney cancer Neg Hx   . Bladder Cancer Neg Hx     Social History:  reports that he quit smoking about 11 years ago. He has never used smokeless tobacco. He reports current alcohol use. He reports that he does not use drugs.  ROS: UROLOGY Frequent Urination?: No Hard to postpone urination?: No Burning/pain with urination?: No Get up at night to urinate?: Yes Leakage of urine?: No Urine stream starts and stops?: No Trouble starting stream?: No Do you have to strain to urinate?: No Blood in urine?: No Urinary tract infection?: No Sexually transmitted disease?: No Injury to kidneys or bladder?: No Painful intercourse?: No Weak stream?: No Erection problems?: No Penile pain?: No  Gastrointestinal Nausea?: No Vomiting?: No Indigestion/heartburn?: No Diarrhea?: No Constipation?: No  Constitutional Fever: No Night sweats?: No Weight loss?: No Fatigue?: No  Skin Skin rash/lesions?: No Itching?: No  Eyes Blurred vision?: No Double vision?: No  Ears/Nose/Throat Sore throat?: No Sinus problems?: No  Hematologic/Lymphatic Swollen glands?: No Easy bruising?: No  Cardiovascular Leg swelling?: No Chest pain?: No  Respiratory Cough?: No Shortness of breath?: No  Endocrine Excessive thirst?: No  Musculoskeletal Back pain?: No Joint pain?: No  Neurological Headaches?: No Dizziness?: No  Psychologic Depression?: No Anxiety?: No   Physical Exam: BP (!) 171/115 (BP Location: Left Arm, Patient Position: Sitting, Cuff Size: Large)   Pulse (!) 48   Ht 6\' 3"  (1.905 m)   Wt 300 lb (136.1 kg)   BMI 37.50 kg/m   Constitutional:  Well nourished. Alert and oriented, No acute distress. HEENT: [place] AT, moist mucus membranes.  Trachea midline, no masses. Cardiovascular: No clubbing, cyanosis, or edema. Respiratory: Normal respiratory effort, no increased work of breathing. GI: Abdomen is soft, non tender, non distended, no abdominal masses. Liver and spleen not palpable.  No hernias appreciated.  Stool sample for occult  testing is not indicated.   GU: No CVA tenderness.  No bladder fullness or masses.  Patient with circumcised phallus. Urethral meatus is patent.  No penile discharge. No penile lesions or rashes. Scrotum without lesions, cysts, rashes and/or edema.  Testicles are located scrotally bilaterally. No masses are appreciated in the testicles. Left and right epididymis are normal. Rectal: Patient with  normal sphincter tone. Anus and perineum without scarring or rashes. No rectal masses are appreciated. Prostate is approximately 50 grams, could only palpate the apex, no nodules are appreciated.  Lymph: No inguinal adenopathy. Neurologic: Grossly intact, no focal deficits, moving all 4 extremities. Psychiatric: Normal mood and affect.   Laboratory Data: Lab Results  Component Value Date   WBC 4.7 10/27/2011   HGB 16.4 10/27/2011   HCT 47.9 10/27/2011   MCV 89 10/27/2011   PLT 171 10/27/2011    PSA history  0.4 ng/mL on 07/02/2014  0.4 ng/mL on 08/06/2015   0.5 ng/mL on 09/01/2016  Results for orders placed or performed in visit on 09/20/18  Bladder Scan (Post Void Residual) in office  Result Value Ref Range   Scan Result 56    I have reviewed the labs.  Urinalysis Negative.   See EPIC.   I have reviewed the labs.   Assessment & Plan:    1. History of hematuria Completed CTU in 2017 - no malignancies  found Patient refused follow up cystoscopy, but did undergo a cystoscopy in 2016 which was NED Urinalysis, Complete - negative for hematuria  2. Adrenal adenomas Recommended a referral to endocrinology as adrenal adenomas can sometimes be metabolically active - he would like to defer the referral at this time until next year due to financial constraints as he has a high deductible as well as defer a repeat CT scan - adenomas have been stable over the last three years   3. BPH with LUTS I PSS score is 17/3, it is stable Continue conservative management, avoiding bladder irritants and timed voiding's Most bothersome symptoms is/are urgency Continue tamsulosin 0.4 mg daily;  refills given RTC in 12 months for IPSS, PSA and exam   4. Urgency Given Toviaz 4 mg daily samples, # 28 Patient will call if he desires a prescription Advised of the side effects, such as: Dry eyes, dry mouth, constipation, mental confusion and/or urinary retention  Return in about 1 year (around 09/20/2019) for IPSS, PSA and exam.  These notes generated with voice recognition software. I apologize for typographical errors.  [name], PA-C  [facility] [address] [place], [place] [address] [phone]

## 2018-09-20 ENCOUNTER — Other Ambulatory Visit: Payer: Self-pay

## 2018-09-20 ENCOUNTER — Ambulatory Visit: Payer: PRIVATE HEALTH INSURANCE | Admitting: Urology

## 2018-09-20 ENCOUNTER — Encounter: Payer: Self-pay | Admitting: Urology

## 2018-09-20 VITALS — BP 171/115 | HR 48 | Ht 75.0 in | Wt 300.0 lb

## 2018-09-20 DIAGNOSIS — R3915 Urgency of urination: Secondary | ICD-10-CM | POA: Diagnosis not present

## 2018-09-20 DIAGNOSIS — Z87448 Personal history of other diseases of urinary system: Secondary | ICD-10-CM

## 2018-09-20 DIAGNOSIS — N138 Other obstructive and reflux uropathy: Secondary | ICD-10-CM

## 2018-09-20 DIAGNOSIS — D3500 Benign neoplasm of unspecified adrenal gland: Secondary | ICD-10-CM | POA: Diagnosis not present

## 2018-09-20 DIAGNOSIS — N401 Enlarged prostate with lower urinary tract symptoms: Secondary | ICD-10-CM

## 2018-09-20 LAB — URINALYSIS, COMPLETE
Bilirubin, UA: NEGATIVE
Glucose, UA: NEGATIVE
Ketones, UA: NEGATIVE
Leukocytes,UA: NEGATIVE
Nitrite, UA: NEGATIVE
Protein,UA: NEGATIVE
RBC, UA: NEGATIVE
Specific Gravity, UA: 1.01 (ref 1.005–1.030)
Urobilinogen, Ur: 0.2 mg/dL (ref 0.2–1.0)
pH, UA: 5.5 (ref 5.0–7.5)

## 2018-09-20 LAB — MICROSCOPIC EXAMINATION
Bacteria, UA: NONE SEEN
RBC: NONE SEEN /hpf (ref 0–2)

## 2018-09-20 LAB — BLADDER SCAN AMB NON-IMAGING: Scan Result: 56

## 2018-09-20 MED ORDER — FESOTERODINE FUMARATE ER 4 MG PO TB24: 4.0000 mg | ORAL_TABLET | Freq: Every day | ORAL | 0 refills | Status: DC

## 2018-09-25 ENCOUNTER — Ambulatory Visit: Payer: PRIVATE HEALTH INSURANCE | Admitting: Urology

## 2019-05-26 ENCOUNTER — Ambulatory Visit: Payer: Self-pay | Attending: Internal Medicine

## 2019-05-26 DIAGNOSIS — Z23 Encounter for immunization: Secondary | ICD-10-CM | POA: Insufficient documentation

## 2019-05-26 NOTE — Progress Notes (Signed)
   Covid-19 Vaccination Clinic  Name:  Jason Tran    MRN: [id number] DOB: 02/04/1966  05/26/2019  Mr. Jason Tran was observed post Covid-19 immunization for 15 minutes without incident. He was provided with Vaccine Information Sheet and instruction to access the V-Safe system.   Mr. Jason Tran was instructed to call 911 with any severe reactions post vaccine: Marland Kitchen Difficulty breathing  . Swelling of face and throat  . A fast heartbeat  . A bad rash all over body  . Dizziness and weakness   Immunizations Administered    Name Date Dose VIS Date Route   Moderna COVID-19 Vaccine 05/26/2019  3:38 PM 0.5 mL 02/20/2019 Intramuscular   Manufacturer: Moderna   Lot: [id number]   [id number]

## 2019-06-23 ENCOUNTER — Ambulatory Visit: Payer: PRIVATE HEALTH INSURANCE | Attending: Internal Medicine

## 2019-06-23 DIAGNOSIS — Z23 Encounter for immunization: Secondary | ICD-10-CM

## 2019-06-23 NOTE — Progress Notes (Signed)
   Covid-19 Vaccination Clinic  Name:  Jason Tran    MRN: [id number] DOB: 14-Dec-1965  06/23/2019  [name] was observed post Covid-19 immunization for 15 minutes without incident. He was provided with Vaccine Information Sheet and instruction to access the V-Safe system.   [name] was instructed to call 911 with any severe reactions post vaccine: Marland Kitchen Difficulty breathing  . Swelling of face and throat  . A fast heartbeat  . A bad rash all over body  . Dizziness and weakness   Immunizations Administered    Name Date Dose VIS Date Route   Moderna COVID-19 Vaccine 06/23/2019 11:18 AM 0.5 mL 02/20/2019 Intramuscular   Manufacturer: Moderna   [id number]   [id number]

## 2019-09-19 ENCOUNTER — Other Ambulatory Visit: Payer: PRIVATE HEALTH INSURANCE

## 2019-09-21 ENCOUNTER — Ambulatory Visit: Payer: PRIVATE HEALTH INSURANCE | Admitting: Urology

## 2019-09-26 ENCOUNTER — Other Ambulatory Visit: Payer: Self-pay | Admitting: *Deleted

## 2019-09-26 DIAGNOSIS — N401 Enlarged prostate with lower urinary tract symptoms: Secondary | ICD-10-CM

## 2019-09-27 ENCOUNTER — Other Ambulatory Visit: Payer: Self-pay

## 2019-09-27 ENCOUNTER — Other Ambulatory Visit: Payer: BC Managed Care – PPO

## 2019-09-27 DIAGNOSIS — N401 Enlarged prostate with lower urinary tract symptoms: Secondary | ICD-10-CM

## 2019-09-28 LAB — PSA: Prostate Specific Ag, Serum: 0.5 ng/mL (ref 0.0–4.0)

## 2019-10-04 NOTE — Progress Notes (Signed)
9:38 AM   Jason Tran 1966/01/24 [id number]  Referring provider: [name], MD [address] [place],  [place] [address]  Chief Complaint  Patient presents with  . Hematuria    HPI: Jason Tran is a 54 y.o. male who presents today for a one year follow up for history of hematuria, bilateral adrenal adenomas and BPH with LU TS.       History of hematuria Cystoscopy with [name] in 07/2014 noted mild inflammation of the prostate.  CTU in 09/11/2015 noted no renal, ureteral or bladder calculi or mass. Multiple benign appearing renal cysts.  Bilateral adrenal gland adenomas.  No acute abdominal/pelvic findings, mass lesions or adenopathy.  He is a former smoker, with a 1 ppd history.  Quit 12 years ago.  He denies gross hematuria.  His UA today is negative for micro heme.   BPH WITH LUTS  (prostate and/or bladder) IPSS score: 17/3   PVR:  99  mL    Previous score: 17/3   Previous PVR: 56 mL  Major complaint(s): Intermittent dysuria and a weak urinary stream.  The Toviaz given to him last year, he did not find it effective and he is no longer taking that medication.  Denies any hematuria or suprapubic pain.  Currently taking: Flomax 0.4 mg daily.  Denies any recent fevers, chills, nausea or vomiting.  He does not have a family history of PCa.   IPSS    Row Name 10/05/19 0900         International Prostate Symptom Score   How often have you had the sensation of not emptying your bladder? Less than half the time     How often have you had to urinate less than every two hours? Less than half the time     How often have you found you stopped and started again several times when you urinated? About half the time     How often have you found it difficult to postpone urination? About half the time     How often have you had a weak urinary stream? More than half the time     How often have you had to strain to start urination? Less than 1 in 5 times     How many times did you  typically get up at night to urinate? 2 Times     Total IPSS Score 17       Quality of Life due to urinary symptoms   If you were to spend the rest of your life with your urinary condition just the way it is now how would you feel about that? Mixed            Score:  1-7 Mild 8-19 Moderate 20-35 Severe  Bilateral adrenal adenomas Stable on CT in 09/2017 compared to 08/2015 exam  PMH: Past Medical History:  Diagnosis Date  . 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 Allergic rhinitis, seasonal 12/26/2013  . 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 BP (high blood pressure) 11/21/2014  . BPH (benign prostatic hyperplasia)   . Chronic prostatitis   . 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 Disorder of kidney 11/21/2014  . Elevated PSA   . Gout   . HTN (hypertension)   . Prostatitis 10/21/2014  . Renal disease   . Sleep apnea     Surgical History: Past Surgical History:  Procedure  Laterality Date  . COLONOSCOPY WITH PROPOFOL N/A 09/15/2015   Procedure: COLONOSCOPY WITH PROPOFOL;  Surgeon: [name], MD;  [facility] END[facility]COPY;  Service: Endoscopy;  Laterality: N/A;  . CYSTOSCOPY      Home Medications:  Allergies as of 10/05/2019   No Known Allergies     Medication List       Accurate as of October 05, 2019  9:38 AM. If you have any questions, ask your nurse or doctor.        allopurinol 100 MG tablet Commonly known as: ZYLOPRIM Take 150 mg by mouth daily.   carvedilol 12.5 MG tablet Commonly known as: COREG Take 12.5 mg by mouth 2 (two) times daily.   eplerenone 50 MG tablet Commonly known as: INSPRA Take 50 mg by mouth daily.   etodolac 500 MG tablet Commonly known as: LODINE Take 500 mg by mouth daily as needed (pain).   fesoterodine 4 MG Tb24 tablet Commonly known as: TOVIAZ Take 1 tablet (4 mg total) by mouth daily.   furosemide 20 MG tablet Commonly known as:  LASIX Take 20 mg by mouth daily.   indomethacin 50 MG capsule Commonly known as: INDOCIN Take 50 mg by mouth daily as needed for pain.   isosorbide mononitrate 30 MG 24 hr tablet Commonly known as: IMDUR Take 30 mg by mouth daily.   loratadine 10 MG tablet Commonly known as: CLARITIN   metoprolol 200 MG 24 hr tablet Commonly known as: TOPROL-XL Take 200 mg by mouth daily.   tamsulosin 0.4 MG Caps capsule Commonly known as: FLOMAX Take 1 capsule (0.4 mg total) by mouth daily.   Tribenzor 40-10-25 MG Tabs Generic drug: Olmesartan-amLODIPine-HCTZ Take 1 tablet by mouth daily.   vitamin B-12 1000 MCG tablet Commonly known as: CYANOCOBALAMIN Take 1,000 mcg by mouth daily.   Vitamin D (Ergocalciferol) 1.25 MG (50000 UNIT) Caps capsule Commonly known as: DRISDOL Take 50,000 Units by mouth every 7 (seven) days.       Allergies: No Known Allergies  Family History: Family History  Problem Relation Age of Onset  . Benign prostatic hyperplasia Unknown   . Hypertension Unknown   . Kidney disease Mother   . Kidney disease Maternal Uncle   . Prostate cancer Neg Hx   . Kidney cancer Neg Hx   . Bladder Cancer Neg Hx     Social History:  reports that he quit smoking about 12 years ago. He has never used smokeless tobacco. He reports current alcohol use. He reports that he does not use drugs.  ROS: For pertinent review of systems please refer to history of present illness  Physical Exam: BP (!) 155/95   Pulse (!) 47   Ht 6\' 3"  (1.905 m)   Wt 298 lb (135.2 kg)   BMI 37.25 kg/m   Constitutional:  Well nourished. Alert and oriented, No acute distress. HEENT: [place] AT, mask in place.  Trachea midline Cardiovascular: No clubbing, cyanosis, or edema. Respiratory: Normal respiratory effort, no increased work of breathing. GI: Abdomen is soft, non tender, non distended, no abdominal masses. Liver and spleen not palpable.  No hernias appreciated.  Stool sample for occult testing  is not indicated.   GU: No CVA tenderness.  No bladder fullness or masses.  Patient with uncircumcised phallus.  Foreskin easily retracted  Urethral meatus is patent.  No penile discharge. No penile lesions or rashes. Scrotum without lesions, cysts, rashes and/or edema.  Testicles are located scrotally bilaterally. No masses are appreciated  in the testicles. Left and right epididymis are normal. Rectal: Patient with  normal sphincter tone. Anus and perineum without scarring or rashes. No rectal masses are appreciated. Prostate is approximately 50 grams, could only palpate the apex and midportion of the gland, no nodules are appreciated. Seminal vesicles could not be palpated Skin: No rashes, bruises or suspicious lesions. Lymph: No inguinal adenopathy. Neurologic: Grossly intact, no focal deficits, moving all 4 extremities. Psychiatric: Normal mood and affect.   Laboratory Data: Lab Results  Component Value Date   WBC 4.7 10/27/2011   HGB 16.4 10/27/2011   HCT 47.9 10/27/2011   MCV 89 10/27/2011   PLT 171 10/27/2011    PSA history  0.4 ng/mL on 07/02/2014   Component     Latest Ref Rng & Units 08/06/2015 09/01/2016 09/05/2017 09/18/2018  Prostate Specific Ag, Serum     0.0 - 4.0 ng/mL 0.4 0.5 0.5 0.5   Component     Latest Ref Rng & Units 09/27/2019  Prostate Specific Ag, Serum     0.0 - 4.0 ng/mL 0.5   Urinalysis Component     Latest Ref Rng & Units 10/05/2019  Specific Gravity, UA     1.005 - 1.030 1.010  pH, UA     5.0 - 7.5 5.0  Color, UA     Yellow Yellow  Appearance Ur     Clear Clear  Leukocytes,UA     Negative Negative  Protein,UA     Negative/Trace Negative  Glucose, UA     Negative Negative  Ketones, UA     Negative Negative  RBC, UA     Negative Negative  Bilirubin, UA     Negative Negative  Urobilinogen, Ur     0.2 - 1.0 mg/dL 0.2  Nitrite, UA     Negative Negative  Microscopic Examination      See below:   Component     Latest Ref Rng & Units  10/05/2019  WBC, UA     0 - 5 /hpf 0-5  RBC     0 - 2 /hpf 0-2  Epithelial Cells (non renal)     0 - 10 /hpf 0-10  Bacteria, UA     None seen/Few None seen   I have reviewed the labs.  Pertinent Imaging Results for orders placed or performed in visit on 10/05/19  BLADDER SCAN AMB NON-IMAGING  Result Value Ref Range   Scan Result 99 ML      Assessment & Plan:    1. History of hematuria Completed CTU in 2017 - no malignancies found Patient refused follow up cystoscopy, but did undergo a cystoscopy in 2016 which was NED Urinalysis, Complete - negative for hematuria  2. Adrenal adenomas Recommended a referral to endocrinology as adrenal adenomas can sometimes be metabolically active - he would still like to defer the referral at this time until next year due to financial constraints as he has a high deductible as well as defer a repeat CT scan and now upcoming sinus surgery - adenomas have been stable over the last three years   3. BPH with LUTS I PSS score is 17/3, it is stable Continue conservative management, avoiding bladder irritants and timed voiding's Most bothersome symptoms is/are intermittent dysuria and a weak urinary stream Continue tamsulosin 0.4 mg daily;  refills given Discussed undergoing cystoscopy as he stated he was interested in a minimally invasive treatment for his prostate issues, but he deferred at this time.  I also discussed there  is a possibility in the intermittent dysuria may be a sign of bladder cancer.  He still defers the cystoscopic at this time mostly due to financial concerns RTC in 12 months for IPSS, PSA and exam    Return in about 1 year (around 10/04/2020) for IPSS, UA, PSA and exam.  These notes generated with voice recognition software. I apologize for typographical errors.  [name], PA-C  [facility] [address] [place], [place] [address] [phone]

## 2019-10-05 ENCOUNTER — Encounter: Payer: Self-pay | Admitting: Urology

## 2019-10-05 ENCOUNTER — Encounter
Admission: RE | Admit: 2019-10-05 | Discharge: 2019-10-05 | Disposition: A | Discharge status: Home / Self Care | Payer: PRIVATE HEALTH INSURANCE | Source: Ambulatory Visit | Attending: Unknown Physician Specialty | Admitting: Unknown Physician Specialty

## 2019-10-05 ENCOUNTER — Ambulatory Visit (INDEPENDENT_AMBULATORY_CARE_PROVIDER_SITE_OTHER): Payer: BC Managed Care – PPO | Admitting: Urology

## 2019-10-05 ENCOUNTER — Other Ambulatory Visit: Payer: Self-pay

## 2019-10-05 VITALS — BP 155/95 | HR 47 | Ht 75.0 in | Wt 298.0 lb

## 2019-10-05 DIAGNOSIS — N138 Other obstructive and reflux uropathy: Secondary | ICD-10-CM

## 2019-10-05 DIAGNOSIS — Z87448 Personal history of other diseases of urinary system: Secondary | ICD-10-CM

## 2019-10-05 DIAGNOSIS — N401 Enlarged prostate with lower urinary tract symptoms: Secondary | ICD-10-CM

## 2019-10-05 DIAGNOSIS — D3500 Benign neoplasm of unspecified adrenal gland: Secondary | ICD-10-CM | POA: Diagnosis not present

## 2019-10-05 DIAGNOSIS — R3915 Urgency of urination: Secondary | ICD-10-CM

## 2019-10-05 HISTORY — DX: Gastro-esophageal reflux disease without esophagitis: K21.9

## 2019-10-05 HISTORY — DX: Benign neoplasm of unspecified adrenal gland: D35.00

## 2019-10-05 HISTORY — DX: Unspecified osteoarthritis, unspecified site: M19.90

## 2019-10-05 LAB — BLADDER SCAN AMB NON-IMAGING: Scan Result: 99

## 2019-10-05 MED ORDER — TAMSULOSIN HCL 0.4 MG PO CAPS: 0.4000 mg | ORAL_CAPSULE | Freq: Every day | ORAL | 3 refills | Status: DC

## 2019-10-05 NOTE — Pre-Procedure Instructions (Signed)
Progress Notes - documented in this encounter Neysa Hotter, MD - 07/16/2019 8:30 AM EDT Formatting of this note is different from the original. PCP: Marina Goodell, MD   Urologist: Dr. Evelene Croon  Chief Complaint: Follow up for hypertension   Background: Jason Tran is a 54 y.o. AAM with a past medical history of HTN, incidental bilateral renal cysts, gout, and history of prostatitis. His renal cysts have NOT been attributed to ADPKD. He was first seen with [place] in 2009 and returned to care in 2013.   HPI: Terence Bart returns today for follow-up. I last saw him in 04/2018  Notes that for the past 3-4 months his BPs have been running higher. States he had his physical and PCP said that if BPs weren't doing better in a month they would go up on his medications. He has been monitoring on his own and it is running 145-150/85-90. Notes that he has made some diet changes to try and help with this but no change in BP with this (limiting/cutting back on high salt foods like bacon).   Reports having gout flares anywhere from once a month.   Has had both doses of the COVID vaccine.   ROS: 10 system ROS negative except per HPI listed above  PAST MEDICAL HISTORY: Past Medical History:  Diagnosis Date  . Acquired cystic kidney disease  . Allergic  . Arthritis  . Hypertension  . Obesity (BMI 30-39.9)  . Prostatitis   SOCIAL HISTORY: Lives alone locally. Works for [employer]. No T/D/E. Adult son who is in [employer] [place]  ALLERGIES Patient has no known allergies.  MEDICATIONS: Current Outpatient Medications  Medication Sig Dispense Refill  . allopurinoL (ZYLOPRIM) 100 MG tablet  . cyanocobalamin, vitamin B-12, 1000 MCG tablet Take 1,000 mcg by mouth.  Marland Kitchen eplerenone (INSPRA) 50 MG tablet Take 1 tablet (50 mg total) by mouth once daily. 90 tablet 4  . ergocalciferol (DRISDOL) 50,000 unit capsule 0  . etodolac (LODINE) 500 MG tablet Take 500 mg by mouth Two (2) times  a day.  . fluticasone (FLONASE) 50 mcg/actuation nasal spray  . furosemide (LASIX) 20 MG tablet Take 20 mg by mouth daily as needed.  . indomethacin (INDOCIN) 50 MG capsule  . loratadine 10 mg cap Take 10 mg by mouth.  . metoprolol succinate (TOPROL-XL) 200 MG 24 hr tablet Take 200 mg by mouth once daily.  . tamsulosin (FLOMAX) 0.4 mg capsule  . TRIBENZOR 40-10-25 mg Tab Take 1 tablet by mouth daily. 0  . ergocalciferol (DRISDOL) 50,000 unit capsule  . fesoterodine (TOVIAZ) 4 mg 24 hr tablet Take 4 mg by mouth. (Patient not taking: Reported on 07/16/2019)   No current facility-administered medications for this visit.   PHYSICAL EXAM: Vitals:  07/16/19 0833  BP: 150/91  Pulse: (!) 48  Temp: 36.8 C (98.3 F)   CONSTITUTIONAL: Comfortable, well-appearing. HEENT: EOMI, sclerae anicteric LUNGS: normal WOB EXTR: No edema NEURO normal gait  MEDICAL DECISION MAKING 07/15/2016: Uric acid 7.8, A1c 5.7, Na 139, K 3.4, Cl 101, Bicarb 29, BUN 17, Cr 1.4, eGFR >60, Gluc 89, Ca 9.7, Albumin 4.2  10/16/2014: Na 138, K 3.7, Cl 98, Bicarb 23, BUN 18, Cr 1.37, eGFR > 60, Gluc 95, Ca 9.3  09/11/15 CT A/P at [facility] reviewed in care everywhere: cysts o/w normal.  10/24/2013: Chem: Na 138, K 3.5, Cl 103, Bicarb 29, BUN 20, Cr 1.4, eGFR >60, Gluc 162, Ca 9.3 WBC 4.4 Hb 16, Plt  170 MACR 10  11/13/12: CBC WBC 4.7, hemoglobin 16.6 hematocrit 47.2, platelets 171 Chemistries sodium 137 potassium 3.9 chloride 103 bicarbonate 29 BUN 15 creatinine 1.2 calcium 9.1 EGFR greater than 60  ASSESSMENT/PLAN: Mr. Beranek is a 54 y.o. African-American man with a past medical history of obesity, history of prostatitis, hypertension, incidental bilateral renal cysts who presents to nephrology clinic for follow-up.  Resistant HTN: Reports adherence to his regimen. Checks his BP in evenings and running higher recently.  - Current meds: metoprolol succinate '200mg'$  QD, olmesartan/amlodipine/HCTZ 40/10/'25mg'$ , eplerenone '50mg'$  QD,  furosemide '20mg'$  QD. Also on tamsulosin 0.'4mg'$  QHS for obstructive symptoms.  - Change metoprolol to carvedilol. Given his bradycardia and symptoms he is endorsing, will decrease dose (12.'5mg'$  rather than '25mg'$ ) - Start Imdur '30mg'$  daily, titrate as needed  Bradycardia: HR 48 in clinic today. States he has been told for years his HR is low and that it is due to one of his medications. However he also endorses occasional orthostasis and fatigue.  - Switch metoprolol to carvedilol for increased BP effect, decrease dose to 12.'5mg'$  rather than corresponding max dose of 25-'50mg'$  - F/u HR and sx next visit  Cystic kidneys: Not thought to be PCKD. No utility in repeating serial [place] unless clinically indicated.  H/o elevated creatinine: He's had mildly elevated creatinine in the past. On 24-hour urine collections his CrCl has been normal though.  - Labs done 05/31/19 with Cr 1.34, which was stable/at his baseline (going back to 2015).   Gout: reports having flares anywhere from once a month to once every couple months. Will resolve with indomethacin within a few doses.  - On allopurinol '100mg'$  daily. Uric acid level was 6.7 in 05/2019 - increase allopurinol to '150mg'$  daily.  - Check uric acid level at next visit - Takes indomethacin, typically 2-3 doses, for flares. Consider switching to colchicine to avoid NSAIDs given elevated Cr and HTN.   Obstructive urinary symptoms: symptoms are stable for about 7 years.  - Continue flomax 0.'4mg'$  daily - Follows with urology once a year, returning soon.   Pre-DM: A1C in 05/2019 was 5.9 (6.0 in 04/2018).  - Discussed today  Mr.Schylar Dimmitt will follow up in 1 year or sooner if needed.   I personally spent 35 minutes face-to-face and non-face-to-face in the care of this patient, which includes all pre, intra, and post visit time on the date of service.    Electronically signed by Mardelle Matte, MD at 07/16/2019 1:10 PM EDT  Plan of Treatment - documented as of  this encounter Not on file  Visit Diagnoses - documented in this encounter Diagnosis  Acquired cystic kidney disease - Primary   Essential hypertension  Unspecified essential hypertension   Gout, unspecified cause, unspecified chronicity, unspecified site   Prediabetes  Other abnormal glucose   Elevated serum creatinine  Other nonspecific findings on examination of blood   Bradycardia  Other specified cardiac dysrhythmias   Discontinued Medications - documented as of this encounter Medication Sig Discontinue Reason Start Date End Date  metoprolol succinate (TOPROL-XL) 200 MG 24 hr tablet  Take 200 mg by mouth once daily.  02/10/2012 07/16/2019  allopurinoL (ZYLOPRIM) 100 MG tablet   Reorder 04/13/2019 07/16/2019  Historical Medications - added in this encounter This list may reflect changes made after this encounter.  Medication Sig Dispensed Refills Start Date End Date  etodolac (LODINE) 500 MG tablet  Take 500 mg by mouth Two (2) times a day.  0  Images Patient Demographics  Patient Address Communication Language Race / Ethnicity Marital Status  [id number] [place], [place] [address] [phone] (Home) English (Preferred) Black or African American / Not Hispanic or Latino Married  Patient Contacts  Contact Name Contact Address Communication Relationship to Patient  Genevieve Ritzel Unknown [phone] [facility]) Emergency Contact  Document Information  Primary Care Provider Other Service Providers Document Coverage Dates  Heron Nay, MD (Aug. 15, 2014August 15, 2014 - Present) [phone] (Work) [phone] (Fax)  Family Medicine    Apr. 26, 2021April 26, 2021 - Apr. 27, 2021April 27, 2021   Rella Larve  [facility] [address] [place], [place] [address]   Encounter Providers Encounter Date  Mardelle Matte, MD (Attending) [phone] (Work) [phone] (Fax) [address]  [place], [place] [address] Nephrology Apr. 26, 2021April 26, 2021 - Apr. 27, 2021April 27, 2021    Show All Sections

## 2019-10-05 NOTE — Patient Instructions (Addendum)
Your procedure is scheduled on: 10-16-19 TUESDAY Report to Same Day Surgery 2nd floor medical mall [facility] Entrance-take elevator on left to 2nd floor.  Check in with surgery information desk.) To find out your arrival time please call [phone] between 1PM - 3PM on 10-15-19 MONDAY  Remember: Instructions that are not followed completely may result in serious medical risk, up to and including death, or upon the discretion of your surgeon and anesthesiologist your surgery may need to be rescheduled.    _x___ 1. Do not eat food after midnight the night before your procedure. NO GUM OR CANDY AFTER MIDNIGHT. You may drink clear liquids up to 2 hours before you are scheduled to arrive at the hospital for your procedure.  Do not drink clear liquids within 2 hours of your scheduled arrival to the hospital.  Clear liquids include  --Water or Apple juice without pulp  --Gatorade  --Black Coffee or Clear Tea (No milk, no creamers, do not add anything to the coffee or Tea-ok to add sugar)     __x__ 2. No Alcohol for 24 hours before or after surgery.   __x__3. No Smoking or e-cigarettes for 24 prior to surgery.  Do not use any chewable tobacco products for at least 6 hour prior to surgery   ____  4. Bring all medications with you on the day of surgery if instructed.    __x__ 5. Notify your doctor if there is any change in your medical condition     (cold, fever, infections).    x___6. On the morning of surgery brush your teeth with toothpaste and water.  You may rinse your mouth with mouth wash if you wish.  Do not swallow any toothpaste or mouthwash.   Do not wear jewelry, make-up, hairpins, clips or nail polish.  Do not wear lotions, powders, or perfumes. You may wear deodorant.  Do not shave 48 hours prior to surgery. Men may shave face and neck.  Do not bring valuables to the hospital.    [facility] is not responsible for any belongings or valuables.               Contacts,  dentures or bridgework may not be worn into surgery.  Leave your suitcase in the car. After surgery it may be brought to your room.  For patients admitted to the hospital, discharge time is determined by your treatment team.  _  Patients discharged the day of surgery will not be allowed to drive home.  You will need someone to drive you home and stay with you the night of your procedure.    Please read over the following fact sheets that you were given:   [facility] Preparing for Surgery and or MRSA Information   _x___ TAKE THE FOLLOWING MEDICATION THE MORNING OF SURGERY WITH A SMALL SIP OF WATER. These include:  1. COREG (CARVEDILOL)  2. ALLOPURINOL (ZYLOPRIM)  3. IMDUR (ISOSORBIDE)  4. METOPROLOL (TOPROL)  5. FLOMAX (TAMSULOSIN)  6.  ____Fleets enema or Magnesium Citrate as directed.   ____ Use CHG Soap or sage wipes as directed on instruction sheet   ____ Use inhalers on the day of surgery and bring to hospital day of surgery  ____ Stop Metformin and Janumet 2 days prior to surgery.    ____ Take 1/2 of usual insulin dose the night before surgery and none on the morning surgery.   ____ Follow recommendations from Cardiologist, Pulmonologist or PCP regarding stopping Aspirin, Coumadin, Plavix ,Eliquis,  Effient, or Pradaxa, and Pletal.  X____Stop Anti-inflammatories such as Advil, Aleve, Ibuprofen, Motrin, Naproxen, LODINE (ETODOLAC), INDOCIN (INDOMETHICIN) Naprosyn, Goodies powders or aspirin products 7 DAYS PRIOR TO SURGERY-OK to take Tylenol    ____ Stop supplements until after surgery.   ____ Bring C-Pap to the hospital.

## 2019-10-08 ENCOUNTER — Encounter
Admission: RE | Admit: 2019-10-08 | Discharge: 2019-10-08 | Disposition: A | Discharge status: Home / Self Care | Payer: BC Managed Care – PPO | Source: Ambulatory Visit | Attending: Unknown Physician Specialty | Admitting: Unknown Physician Specialty

## 2019-10-08 ENCOUNTER — Encounter: Payer: Self-pay | Admitting: Urgent Care

## 2019-10-08 ENCOUNTER — Other Ambulatory Visit: Payer: Self-pay

## 2019-10-08 DIAGNOSIS — Z01818 Encounter for other preprocedural examination: Secondary | ICD-10-CM | POA: Diagnosis not present

## 2019-10-08 DIAGNOSIS — I10 Essential (primary) hypertension: Secondary | ICD-10-CM | POA: Insufficient documentation

## 2019-10-08 LAB — BASIC METABOLIC PANEL
Anion gap: 8 (ref 5–15)
BUN: 24 mg/dL — ABNORMAL HIGH (ref 6–20)
CO2: 27 mmol/L (ref 22–32)
Calcium: 9.2 mg/dL (ref 8.9–10.3)
Chloride: 102 mmol/L (ref 98–111)
Creatinine, Ser: 1.5 mg/dL — ABNORMAL HIGH (ref 0.61–1.24)
GFR calc Af Amer: >60 mL/min (ref >60)
GFR calc non Af Amer: 52 mL/min — ABNORMAL LOW (ref >60)
Glucose, Bld: 136 mg/dL — ABNORMAL HIGH (ref 70–99)
Potassium: 3.3 mmol/L — ABNORMAL LOW (ref 3.5–5.1)
Sodium: 137 mmol/L (ref 135–145)

## 2019-10-08 LAB — MICROSCOPIC EXAMINATION: Bacteria, UA: NONE SEEN

## 2019-10-08 LAB — URINALYSIS, COMPLETE
Bilirubin, UA: NEGATIVE
Glucose, UA: NEGATIVE
Ketones, UA: NEGATIVE
Leukocytes,UA: NEGATIVE
Nitrite, UA: NEGATIVE
Protein,UA: NEGATIVE
RBC, UA: NEGATIVE
Specific Gravity, UA: 1.01 (ref 1.005–1.030)
Urobilinogen, Ur: 0.2 mg/dL (ref 0.2–1.0)
pH, UA: 5 (ref 5.0–7.5)

## 2019-10-12 ENCOUNTER — Other Ambulatory Visit
Admission: RE | Admit: 2019-10-12 | Discharge: 2019-10-12 | Disposition: A | Discharge status: Home / Self Care | Payer: BC Managed Care – PPO | Source: Ambulatory Visit | Attending: Unknown Physician Specialty | Admitting: Unknown Physician Specialty

## 2019-10-12 ENCOUNTER — Other Ambulatory Visit: Payer: Self-pay

## 2019-10-12 DIAGNOSIS — Z01812 Encounter for preprocedural laboratory examination: Secondary | ICD-10-CM | POA: Insufficient documentation

## 2019-10-12 DIAGNOSIS — Z20822 Contact with and (suspected) exposure to covid-19: Secondary | ICD-10-CM | POA: Diagnosis not present

## 2019-10-12 LAB — SARS CORONAVIRUS 2 (TAT 6-24 HRS): SARS Coronavirus 2: NEGATIVE

## 2019-10-16 ENCOUNTER — Encounter: Payer: Self-pay | Admitting: Anesthesiology

## 2019-10-16 ENCOUNTER — Ambulatory Visit
Admission: RE | Admit: 2019-10-16 | Discharge: 2019-10-16 | Disposition: A | Discharge status: Home / Self Care | Payer: BC Managed Care – PPO | Source: Ambulatory Visit | Attending: Unknown Physician Specialty | Admitting: Unknown Physician Specialty

## 2019-10-16 ENCOUNTER — Encounter: Payer: Self-pay | Admitting: Unknown Physician Specialty

## 2019-10-16 ENCOUNTER — Encounter
Admission: RE | Disposition: A | Discharge status: Home / Self Care | Payer: Self-pay | Source: Ambulatory Visit | Attending: Unknown Physician Specialty

## 2019-10-16 ENCOUNTER — Other Ambulatory Visit: Payer: Self-pay

## 2019-10-16 DIAGNOSIS — J343 Hypertrophy of nasal turbinates: Secondary | ICD-10-CM | POA: Insufficient documentation

## 2019-10-16 DIAGNOSIS — J342 Deviated nasal septum: Secondary | ICD-10-CM | POA: Diagnosis not present

## 2019-10-16 DIAGNOSIS — J322 Chronic ethmoidal sinusitis: Secondary | ICD-10-CM | POA: Diagnosis not present

## 2019-10-16 DIAGNOSIS — J3489 Other specified disorders of nose and nasal sinuses: Secondary | ICD-10-CM | POA: Insufficient documentation

## 2019-10-16 DIAGNOSIS — J320 Chronic maxillary sinusitis: Secondary | ICD-10-CM | POA: Insufficient documentation

## 2019-10-16 HISTORY — PX: MAXILLARY ANTROSTOMY: SHX2003

## 2019-10-16 HISTORY — PX: SEPTOPLASTY: SHX2393

## 2019-10-16 HISTORY — PX: NASAL TURBINATE REDUCTION: SHX2072

## 2019-10-16 HISTORY — PX: IMAGE GUIDED SINUS SURGERY: SHX6570

## 2019-10-16 HISTORY — PX: ETHMOIDECTOMY: SHX5197

## 2019-10-16 SURGERY — SINUS SURGERY, WITH IMAGING GUIDANCE
Anesthesia: General | Site: Nose

## 2019-10-16 MED ORDER — REMIFENTANIL HCL 1 MG IV SOLR
INTRAVENOUS | Status: DC | PRN
  Administered 2019-10-16: .1 ug/kg/min via INTRAVENOUS

## 2019-10-16 MED ORDER — PROPOFOL 10 MG/ML IV BOLUS
INTRAVENOUS | Status: DC | PRN
  Administered 2019-10-16: 200 mg via INTRAVENOUS

## 2019-10-16 MED ORDER — EPHEDRINE SULFATE 50 MG/ML IJ SOLN
INTRAMUSCULAR | Status: DC | PRN
  Administered 2019-10-16: 5 mg via INTRAVENOUS
  Administered 2019-10-16 (×2): 10 mg via INTRAVENOUS
  Administered 2019-10-16: 5 mg via INTRAVENOUS

## 2019-10-16 MED ORDER — ACETAMINOPHEN 10 MG/ML IV SOLN
INTRAVENOUS | Status: AC
  Filled 2019-10-16: qty 100

## 2019-10-16 MED ORDER — DEXAMETHASONE SODIUM PHOSPHATE 10 MG/ML IJ SOLN
INTRAMUSCULAR | Status: DC | PRN
  Administered 2019-10-16: 10 mg via INTRAVENOUS

## 2019-10-16 MED ORDER — SUCCINYLCHOLINE CHLORIDE 20 MG/ML IJ SOLN
INTRAMUSCULAR | Status: DC | PRN
  Administered 2019-10-16: 120 mg via INTRAVENOUS

## 2019-10-16 MED ORDER — OXYCODONE HCL 5 MG/5ML PO SOLN: 5.0000 mg | Freq: Once | ORAL | Status: DC | PRN

## 2019-10-16 MED ORDER — LIDOCAINE HCL (PF) 4 % IJ SOLN
INTRAMUSCULAR | Status: AC
  Filled 2019-10-16: qty 10

## 2019-10-16 MED ORDER — REMIFENTANIL HCL 1 MG IV SOLR
INTRAVENOUS | Status: AC
  Filled 2019-10-16: qty 1000

## 2019-10-16 MED ORDER — PHENYLEPHRINE HCL 10 % OP SOLN
OPHTHALMIC | Status: DC | PRN
  Administered 2019-10-16: 10 mL via TOPICAL

## 2019-10-16 MED ORDER — SULFAMETHOXAZOLE-TRIMETHOPRIM 800-160 MG PO TABS
1.0000 | ORAL_TABLET | Freq: Two times a day (BID) | ORAL | 0 refills | Status: DC
Start: 2019-10-16 — End: 2020-01-28

## 2019-10-16 MED ORDER — OXYCODONE HCL 5 MG PO TABS: 5.0000 mg | ORAL_TABLET | Freq: Once | ORAL | Status: DC | PRN

## 2019-10-16 MED ORDER — FAMOTIDINE 20 MG PO TABS
20.0000 mg | ORAL_TABLET | Freq: Once | ORAL | Status: AC
  Administered 2019-10-16: 20 mg via ORAL

## 2019-10-16 MED ORDER — BACITRACIN ZINC 500 UNIT/GM EX OINT
TOPICAL_OINTMENT | CUTANEOUS | Status: AC
  Filled 2019-10-16: qty 28.35

## 2019-10-16 MED ORDER — FENTANYL CITRATE (PF) 100 MCG/2ML IJ SOLN
INTRAMUSCULAR | Status: AC
  Filled 2019-10-16: qty 2

## 2019-10-16 MED ORDER — MIDAZOLAM HCL 2 MG/2ML IJ SOLN
INTRAMUSCULAR | Status: DC | PRN
  Administered 2019-10-16: 2 mg via INTRAVENOUS

## 2019-10-16 MED ORDER — ACETAMINOPHEN 10 MG/ML IV SOLN
INTRAVENOUS | Status: DC | PRN
  Administered 2019-10-16: 1000 mg via INTRAVENOUS

## 2019-10-16 MED ORDER — OXYCODONE-ACETAMINOPHEN 10-325 MG PO TABS: 1.0000 | ORAL_TABLET | ORAL | 0 refills | Status: DC | PRN

## 2019-10-16 MED ORDER — LIDOCAINE-EPINEPHRINE 1 %-1:100000 IJ SOLN
INTRAMUSCULAR | Status: AC
  Filled 2019-10-16: qty 1

## 2019-10-16 MED ORDER — ORAL CARE MOUTH RINSE: 15.0000 mL | Freq: Once | OROMUCOSAL | Status: AC

## 2019-10-16 MED ORDER — DEXAMETHASONE SODIUM PHOSPHATE 10 MG/ML IJ SOLN
INTRAMUSCULAR | Status: AC
  Filled 2019-10-16: qty 1

## 2019-10-16 MED ORDER — CHLORHEXIDINE GLUCONATE 0.12 % MT SOLN
OROMUCOSAL | Status: AC
  Administered 2019-10-16: 15 mL via OROMUCOSAL
  Filled 2019-10-16: qty 15

## 2019-10-16 MED ORDER — SUCCINYLCHOLINE CHLORIDE 200 MG/10ML IV SOSY
PREFILLED_SYRINGE | INTRAVENOUS | Status: AC
  Filled 2019-10-16: qty 10

## 2019-10-16 MED ORDER — PROPOFOL 10 MG/ML IV BOLUS
INTRAVENOUS | Status: AC
  Filled 2019-10-16: qty 20

## 2019-10-16 MED ORDER — PHENYLEPHRINE HCL (PRESSORS) 10 MG/ML IV SOLN
INTRAVENOUS | Status: DC | PRN
  Administered 2019-10-16 (×3): 100 ug via INTRAVENOUS

## 2019-10-16 MED ORDER — FENTANYL CITRATE (PF) 100 MCG/2ML IJ SOLN
INTRAMUSCULAR | Status: DC | PRN
  Administered 2019-10-16 (×3): 50 ug via INTRAVENOUS

## 2019-10-16 MED ORDER — CHLORHEXIDINE GLUCONATE 0.12 % MT SOLN: 15.0000 mL | Freq: Once | OROMUCOSAL | Status: AC

## 2019-10-16 MED ORDER — DEXMEDETOMIDINE HCL IN NACL 200 MCG/50ML IV SOLN
INTRAVENOUS | Status: DC | PRN
  Administered 2019-10-16: 12 ug via INTRAVENOUS
  Administered 2019-10-16: 8 ug via INTRAVENOUS

## 2019-10-16 MED ORDER — MEPERIDINE HCL 50 MG/ML IJ SOLN: 6.2500 mg | INTRAMUSCULAR | Status: DC | PRN

## 2019-10-16 MED ORDER — LIDOCAINE-EPINEPHRINE 1 %-1:100000 IJ SOLN
INTRAMUSCULAR | Status: DC | PRN
  Administered 2019-10-16: 7 mL

## 2019-10-16 MED ORDER — CIPROFLOXACIN-DEXAMETHASONE 0.3-0.1 % OT SUSP
OTIC | Status: DC | PRN
  Administered 2019-10-16: 4 [drp]

## 2019-10-16 MED ORDER — LACTATED RINGERS IV SOLN: INTRAVENOUS | Status: DC

## 2019-10-16 MED ORDER — BACITRACIN-NEOMYCIN-POLYMYXIN OINTMENT TUBE
TOPICAL_OINTMENT | CUTANEOUS | Status: DC | PRN
  Administered 2019-10-16: 1 via TOPICAL

## 2019-10-16 MED ORDER — OXYMETAZOLINE HCL 0.05 % NA SOLN
NASAL | Status: AC
  Filled 2019-10-16: qty 60

## 2019-10-16 MED ORDER — HYDROMORPHONE HCL 1 MG/ML IJ SOLN
INTRAMUSCULAR | Status: DC | PRN
  Administered 2019-10-16: 1 mg via INTRAVENOUS

## 2019-10-16 MED ORDER — CIPROFLOXACIN-DEXAMETHASONE 0.3-0.1 % OT SUSP
OTIC | Status: AC
  Filled 2019-10-16: qty 7.5

## 2019-10-16 MED ORDER — GLYCOPYRROLATE 0.2 MG/ML IJ SOLN
INTRAMUSCULAR | Status: DC | PRN
  Administered 2019-10-16: .2 mg via INTRAVENOUS

## 2019-10-16 MED ORDER — OXYMETAZOLINE HCL 0.05 % NA SOLN: 6.0000 | Freq: Once | NASAL | Status: DC

## 2019-10-16 MED ORDER — FENTANYL CITRATE (PF) 100 MCG/2ML IJ SOLN: 25.0000 ug | INTRAMUSCULAR | Status: DC | PRN

## 2019-10-16 MED ORDER — LIDOCAINE HCL (CARDIAC) PF 100 MG/5ML IV SOSY
PREFILLED_SYRINGE | INTRAVENOUS | Status: DC | PRN
  Administered 2019-10-16: 100 mg via INTRAVENOUS

## 2019-10-16 MED ORDER — FAMOTIDINE 20 MG PO TABS
ORAL_TABLET | ORAL | Status: AC
  Filled 2019-10-16: qty 1

## 2019-10-16 MED ORDER — HYDROMORPHONE HCL 1 MG/ML IJ SOLN
INTRAMUSCULAR | Status: AC
  Filled 2019-10-16: qty 1

## 2019-10-16 MED ORDER — SODIUM CHLORIDE 0.9 % IV SOLN
INTRAVENOUS | Status: DC | PRN
  Administered 2019-10-16: 25 ug/min via INTRAVENOUS

## 2019-10-16 MED ORDER — PROMETHAZINE HCL 25 MG/ML IJ SOLN: 6.2500 mg | INTRAMUSCULAR | Status: DC | PRN

## 2019-10-16 MED ORDER — GLYCOPYRROLATE 0.2 MG/ML IJ SOLN
INTRAMUSCULAR | Status: AC
  Filled 2019-10-16: qty 1

## 2019-10-16 MED ORDER — LIDOCAINE HCL (PF) 2 % IJ SOLN
INTRAMUSCULAR | Status: AC
  Filled 2019-10-16: qty 5

## 2019-10-16 MED ORDER — ONDANSETRON HCL 4 MG/2ML IJ SOLN
INTRAMUSCULAR | Status: AC
  Filled 2019-10-16: qty 4

## 2019-10-16 MED ORDER — ONDANSETRON HCL 4 MG/2ML IJ SOLN
INTRAMUSCULAR | Status: DC | PRN
  Administered 2019-10-16 (×2): 4 mg via INTRAVENOUS

## 2019-10-16 MED ORDER — MIDAZOLAM HCL 2 MG/2ML IJ SOLN
INTRAMUSCULAR | Status: AC
  Filled 2019-10-16: qty 2

## 2019-10-16 MED ORDER — PHENYLEPHRINE HCL (PRESSORS) 10 MG/ML IV SOLN
INTRAVENOUS | Status: AC
  Filled 2019-10-16: qty 1

## 2019-10-16 MED ORDER — OXYMETAZOLINE HCL 0.05 % NA SOLN
NASAL | Status: DC | PRN
  Administered 2019-10-16: 1

## 2019-10-16 SURGICAL SUPPLY — 39 items
BATTERY INSTRU NAVIGATION (MISCELLANEOUS) ×15 IMPLANT
BLADE SURG 15 STRL LF DISP TIS (BLADE) ×4 IMPLANT
BLADE SURG 15 STRL SS (BLADE) ×5
BNDG EYE OVAL (GAUZE/BANDAGES/DRESSINGS) ×4 IMPLANT
BTRY SRG DRVR LF (MISCELLANEOUS) ×12
CANISTER SUCT 1200ML W/VALVE (MISCELLANEOUS) ×5 IMPLANT
CNTNR SPEC 2.5X3XGRAD LEK (MISCELLANEOUS) ×8
COAG SUCT 10F 3.5MM HAND CTRL (MISCELLANEOUS) ×5 IMPLANT
CONT SPEC 4OZ STER OR WHT (MISCELLANEOUS) ×2
CONT SPEC 4OZ STRL OR WHT (MISCELLANEOUS) ×8
CONTAINER SPEC 2.5X3XGRAD LEK (MISCELLANEOUS) ×8 IMPLANT
COVER WAND RF STERILE (DRAPES) ×5 IMPLANT
CUP MEDICINE 2OZ PLAST GRAD ST (MISCELLANEOUS) ×10 IMPLANT
DEFOGGER SCOPE WARMER CLEARIFY (MISCELLANEOUS) ×1 IMPLANT
DRESSING NASL FOAM PST OP SINU (MISCELLANEOUS) ×8 IMPLANT
DRSG NASAL FOAM POST OP SINU (MISCELLANEOUS)
ELECT REM PT RETURN 9FT ADLT (ELECTROSURGICAL) ×5
ELECTRODE REM PT RTRN 9FT ADLT (ELECTROSURGICAL) ×4 IMPLANT
GLOVE BIO SURGEON STRL SZ7.5 (GLOVE) ×10 IMPLANT
GOWN STRL REUS W/ TWL LRG LVL3 (GOWN DISPOSABLE) ×8 IMPLANT
GOWN STRL REUS W/TWL LRG LVL3 (GOWN DISPOSABLE) ×10
LABEL OR SOLS (LABEL) ×5 IMPLANT
NS IRRIG 500ML POUR BTL (IV SOLUTION) ×5 IMPLANT
PACK HEAD/NECK (MISCELLANEOUS) ×5 IMPLANT
SOL ANTI-FOG 6CC FOG-OUT (MISCELLANEOUS) ×4 IMPLANT
SOL FOG-OUT ANTI-FOG 6CC (MISCELLANEOUS) ×2
SPLINT NASAL REUTER .5MM (MISCELLANEOUS) ×5 IMPLANT
SPLINT NASAL REUTER .5MM BIVLV (MISCELLANEOUS) ×4 IMPLANT
SPONGE NEURO XRAY DETECT 1X3 (DISPOSABLE) ×5 IMPLANT
SUT CHROMIC 3-0 (SUTURE) ×5
SUT CHROMIC 3-0 KS 27XMFL CR (SUTURE) ×4
SUT ETHILON 3-0 KS 30 BLK (SUTURE) ×5 IMPLANT
SUT PLAIN GUT 4-0 (SUTURE) ×5 IMPLANT
SUT SILK 2 0 SH (SUTURE) ×1 IMPLANT
SUTURE CHRMC 3-0 KS 27XMFL CR (SUTURE) ×4 IMPLANT
SWAB CULTURE AMIES ANAERIB BLU (MISCELLANEOUS) ×4 IMPLANT
TAMPON NAS POPE NO STRING (MISCELLANEOUS) ×3 IMPLANT
TRACKER CRANIALMASK (MASK) ×5 IMPLANT
WATER STERILE IRR 1000ML POUR (IV SOLUTION) ×5 IMPLANT

## 2019-10-16 NOTE — Anesthesia Procedure Notes (Signed)
Procedure Name: Intubation Performed by: Kelton Pillar, CRNA Pre-anesthesia Checklist: Patient identified, Emergency Drugs available, Suction available and Patient being monitored Patient Re-evaluated:Patient Re-evaluated prior to induction Oxygen Delivery Method: Circle system utilized Preoxygenation: Pre-oxygenation with 100% oxygen Induction Type: IV induction Ventilation: Two handed mask ventilation required Laryngoscope Size: McGraph and 4 Grade View: Grade I Tube type: Oral Tube size: 7.5 mm Number of attempts: 1 Airway Equipment and Method: Stylet and Oral airway Placement Confirmation: ETT inserted through vocal cords under direct vision,  positive ETCO2,  breath sounds checked- equal and bilateral and CO2 detector Secured at: 22 cm Tube secured with: Tape Dental Injury: Teeth and Oropharynx as per pre-operative assessment

## 2019-10-16 NOTE — Discharge Instructions (Addendum)
Sinus Endoscopy, Care After This sheet gives you information about how to care for yourself after your procedure. Your health care provider may also give you more specific instructions. If you have problems or questions, contact your health care provider. What can I expect after the procedure? After the procedure, it is common to have:  Temporary discomfort in the sinus area.  Minor bleeding.  Minor irritation or damage to the lining of the nose, mouth, and throat (mucous membranes). Depending on any treatments performed during your procedure, you may also have:  Sinus discomfort.  Headache.  Nasal stuffiness (congestion).  Nasal drainage.  Dry nasal passages. Follow these instructions at home:     Medicines  Take or use over-the-counter and prescription medicines only as told by your health care provider.  If you were prescribed an antibiotic medicine, use it as told by your health care provider. Do not stop using the antibiotic even if your condition improves.  Use nasal sprays and nasal rinses as told by your health care provider. General instructions  Avoid blowing your nose and sneezing.  Do not use any products that contain nicotine or tobacco, such as cigarettes and e-cigarettes. If you need help quitting, ask your health care provider.  Keep your head raised (elevated) for the first few nights after surgery, or as directed by your healthcare provider. This helps to decrease inflammation.  Return to your normal activities as told by your health care provider. Ask your health care provider what activities are safe for you.  Keep all follow-up visits as told by your health care provider. This is important. Contact a health care provider if:  You have pain or discomfort that does not get better with over-the-counter medicine.  You have a fever.  You have more clear fluid or blood coming from your nose.  You have pus or a bad smell coming from your nose.  You  have nausea and vomiting. Get help right away if:  You have bleeding from the nose that does not stop.  You have changes in your vision.  You cannot stop vomiting. Summary  After a sinus endoscopy, it is common to have temporary discomfort in the sinus area.  If you were prescribed an antibiotic medicine, use it as told by your health care provider. Do not stop using the antibiotic even if you start to feel better.  Do not use any products that contain nicotine or tobacco, such as cigarettes and e-cigarettes. If you need help quitting, ask your health care provider. This information is not intended to replace advice given to you by your health care provider. Make sure you discuss any questions you have with your health care provider. Document Revised: 06/30/2018 Document Reviewed: 07/23/2016 Elsevier Patient Education  [place]   1) The drugs that you were given will stay in your system until tomorrow so for the next 24 hours you should not:  A) Drive an automobile B) Make any legal decisions C) Drink any alcoholic beverage   2) You may resume regular meals tomorrow.  Today it is better to start with liquids and gradually work up to solid foods.  You may eat anything you prefer, but it is better to start with liquids, then soup and crackers, and gradually work up to solid foods.   3) Please notify your doctor immediately if you have any unusual bleeding, trouble breathing, redness and pain at the surgery site, drainage, fever, or pain  not relieved by medication.    4) Additional Instructions:        Please contact your physician with any problems or Same Day Surgery at [phone], Monday through Friday 6 am to 4 pm, or [place] at [facility] number at [phone].

## 2019-10-16 NOTE — Anesthesia Preprocedure Evaluation (Addendum)
Anesthesia Evaluation  Patient identified by MRN, date of birth, ID band Patient awake    Reviewed: Allergy & Precautions, H&P , NPO status , Patient's Chart, lab work & pertinent test results  History of Anesthesia Complications Negative for: history of anesthetic complications  Airway Mallampati: II  TM Distance: >3 FB Neck ROM: Full    Dental no notable dental hx.    Pulmonary sleep apnea (does not use CPAP, has been having problems with the facemask) , neg COPD, former smoker,    breath sounds clear to auscultation- rhonchi (-) wheezing      Cardiovascular hypertension, Pt. on medications (-) CAD, (-) Past MI, (-) Cardiac Stents and (-) CABG  Rhythm:Regular Rate:Normal - Systolic murmurs and - Diastolic murmurs    Neuro/Psych neg Seizures negative neurological ROS  negative psych ROS   GI/Hepatic Neg liver ROS, GERD  ,  Endo/Other  negative endocrine ROSneg diabetes  Renal/GU Renal disease (cystic kidney disease, normal function)Cystic kidney     Musculoskeletal  (+) Arthritis ,   Abdominal (+) + obese,   Peds  Hematology negative hematology ROS (+)   Anesthesia Other Findings Past Medical History: 12/26/2013: Acquired cyst of kidney No date: Adrenal adenoma 12/26/2013: Allergic rhinitis, seasonal No date: Arthritis 11/21/2014: Benign fibroma of prostate 01/22/2014: Borderline diabetes 11/21/2014: BP (high blood pressure) No date: BPH (benign prostatic hyperplasia) No date: 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 11/21/2014: Disorder of kidney No date: Elevated PSA No date: GERD (gastroesophageal reflux disease)     Comment:  H/O No date: Gout No date: HTN (hypertension) 10/21/2014: Prostatitis No date: Renal disease No date: Sleep apnea     Comment:  DOES NOT USE A CPAP  Past Surgical History: 09/15/2015: COLONOSCOPY WITH PROPOFOL; N/A      Comment:  Procedure: COLONOSCOPY WITH PROPOFOL;  Surgeon: [name], MD;  Location: [place];  Service:               Endoscopy;  Laterality: N/A; No date: CYSTOSCOPY     Reproductive/Obstetrics negative OB ROS                            Anesthesia Physical Anesthesia Plan  ASA: III  Anesthesia Plan: General ETT   Post-op Pain Management:    Induction: Intravenous  PONV Risk Score and Plan: 1 and Ondansetron, Dexamethasone, Midazolam and Treatment may vary due to age or medical condition  Airway Management Planned: Oral ETT  Additional Equipment:   Intra-op Plan:   Post-operative Plan: Extubation in OR  Informed Consent: I have reviewed the patients History and Physical, chart, labs and discussed the procedure including the risks, benefits and alternatives for the proposed anesthesia with the patient or authorized representative who has indicated his/her understanding and acceptance.     Dental Advisory Given  Plan Discussed with: Anesthesiologist, CRNA and Surgeon  Anesthesia Plan Comments:        Anesthesia Quick Evaluation

## 2019-10-16 NOTE — Anesthesia Postprocedure Evaluation (Signed)
Anesthesia Post Note  Patient: Lucio Litsey  Procedure(s) Performed: IMAGE GUIDED SINUS SURGERY (N/A ) SEPTOPLASTY (N/A Nose) TURBINATE REDUCTION/SUBMUCOSAL RESECTION (Bilateral Nose) MAXILLARY ANTROSTOMY (Bilateral ) ETHMOIDECTOMY (Bilateral )  Patient location during evaluation: PACU Anesthesia Type: General Level of consciousness: awake and alert and oriented Pain management: pain level controlled Vital Signs Assessment: post-procedure vital signs reviewed and stable Respiratory status: spontaneous breathing, nonlabored ventilation and respiratory function stable Cardiovascular status: blood pressure returned to baseline and stable Postop Assessment: no signs of nausea or vomiting Anesthetic complications: no   No complications documented.   Last Vitals:  Vitals:   10/16/19 1224 10/16/19 1231  BP: (!) 173/105 (!) 175/113  Pulse: 51 47  Resp: 14 20  Temp:  (!) 36.1 C  SpO2: 97% 98%    Last Pain:  Vitals:   10/16/19 1231  TempSrc: Temporal  PainSc: 0-No pain                 Jalilah Wiltsie

## 2019-10-16 NOTE — H&P (Signed)
The patient's history has been reviewed, patient examined, no change in status, stable for surgery.  Questions were answered to the patients satisfaction.  

## 2019-10-16 NOTE — Op Note (Signed)
PREOPERATIVE DIAGNOSIS:  Chronic nasal obstruction  Chronic ethmoid and maxillary sinusitis  POSTOPERATIVE DIAGNOSIS:  Chronic nasal obstruction.  Same  SURGEON:  Roena Malady, M.D.  NAME OF PROCEDURE:  #1 use of Stryker navigation system #2 bilateral endoscopic anterior and posterior ethmoidectomy with removal of tissue #3 bilateral endoscopic maxillary antrostomy with removal of tissue #4 nasal septoplasty #5 bilateral submucous section inferior turbinates #6 submucous resection of bilateral anterior tips of middle turbinates  OPERATIVE FINDINGS:  Severe nasal septal deformity, hypertrophy of the inferior turbinates.  Chronic thickening ethmoid and maxillary sinuses  DESCRIPTION OF THE PROCEDURE:  Jason Tran was identified in the holding area and taken to the operating room and placed in the supine position.  After general endotracheal anesthesia was induced, the table was turned 45 degrees and the patient was placed in a semi-Fowler position.   The Stryker navigation device was applied and calibrated and remained on throughout the procedure.  The nose was then topically anesthetized with Lidocaine/afrin solution, cotton pledgets were placed within each nostril. After approximately 5 minutes, this was removed at which time a local anesthetic of 1% Lidocaine 1:100,000 units of Epinephrine was used to inject the inferior turbinates in the nasal septum. A total of 13 ml was used. Examination of the nose showed a severe left nasal septal deformity and tremendous hypertrophied inferior turbinate.  Beginning on the right hand side a hemitransfixion incision was then created on the leading edge of the septum on the right.  A subperichondrial plane was elevated posteriorly on the left and taken back to the perpendicular plate of the ethmoid where subperiosteal plane was elevated posteriorly on the left.  An inferior rim of cartilage was removed anteriorly with care taken to leave an anterior strut to  prevent nasal collapse. With this strut removed the perpendicular plate of the ethmoid was separated from the quadrangular cartilage. The septal spur was removed.  The septum was then replaced in the midline. Reinspection through each nostril showed excellent reduction of the septal deformity. A left posterior inferior fenestration was then created to allow hematoma drainage.  With the septoplasty completed, the operation turned to the endoscopic sinus portion.  Beginning on the left-hand side a 0 degree endoscope was introduced into the nose.  Examination the nose showed septum to believe well reduced in the midline however does have significant narrowing of the nasal vault superiorly the turbinate was enlarged and completely obstructing the ostiomeatal unit therefore was deemed necessary to remove the anterior tip of the middle turbinate the curved endoscopic scissors were then used to excise anterior tip as were the true cuts.  This was removed this allowed access to the ethmoid bulla and the uncinate process.  On the left the uncinate process was taken down with the caudal elevator a curved suction was then placed within the maxillary sinus and the antrostomy was opened widely with a straight and side-biting forceps.  The navigation device was then used to identify the ethmoid bulla this was opened the anterior and posterior ethmoids were cleaned using the straight and 45 degree forceps.  Suction cautery was then used to cauterize the tip of the middle turbinate stump for hemostasis.  The right side was examined in similar fashion the middle turbinate was completely obstructing the ostiomeatal view.  Again the curved scissors and the Tru-Cut was used to remove the anterior tip of the middle turbinate.  This gave excellent visualization of the ostiomeatal unit.  The uncinate process was taken down  with the caudal elevator the straight and side-biting forceps were then used to open the maxillary colostomy widely.   The navigator was again used to identified the ethmoid bulla and the anterior and posterior ethmoid cells were open and clean again there was polypoid and thickened mucosa.  The right side completed the left side was reexamined the sinuses were patent there was significant ooze from the ethmoid and maxillary cavities.  With the sinus portion completed the pledgets were replaced in position while the inferior turbinates were reduced. Beginning on the left-hand side, a 15 blade was used to incise along the inferior edge of the inferior turbinate. A superior laterally based flap was then elevated. The underlying conchal bone of mucosa was excised using Knight scissors. The flap was then laid back over the turbinate stump and cauterized using suction cautery. In a similar fashion the submucous resection was performed on the right.  With the submucous resection completed bilaterally and no active bleeding, the hemitransfixion incision was then closed using two interrupted 3-0 chromic sutures.   The pledgets were removed the 0 degree endoscope was introduced into both sides there was no severe bleeding but did continue to ooze therefore I felt nasal packing would be warranted.  Plastic nasal septal splints were placed within each nostril and affixed to the septum using a 3-0 nylon suture.  Once the splints were in position 2 small Merocel sponges with 2-0 silk was placed through them for future removal were placed up into the ethmoid cavities.  These were inflated using Ciprodex drops.  Standard AP Merocel sponges were then placed inferiorly beneath the inferior turbinate these were filled with combination of Ciprodex and Afrin nasal spray.   The sutures from the small Merocel sponge were brought on the nose anteriorly and tied to 1 another to prevent aspiration the sutures will be used to remove these packing in the office next week.  Inspection of the oropharynx showed no evidence of active bleeding.  The patient was  then returned to anesthesia where he was awakened in the operating room and taken recovery room in stable condition.  Cultures: None  Specimen: Sinus contents  The patient tolerated the procedure well, was returned to anesthesia, extubated in the operating room, and taken to the recovery room in stable condition.   Patient will be discharged home follow-up in 1 week for pack removal and splint removal.    ESTIMATED BLOOD LOSS:  25 cc.  Roena Malady  10/16/2019  11:13 AM

## 2019-10-16 NOTE — Transfer of Care (Signed)
Immediate Anesthesia Transfer of Care Note  Patient: Sir Mallis  Procedure(s) Performed: IMAGE GUIDED SINUS SURGERY (N/A ) SEPTOPLASTY (N/A Nose) TURBINATE REDUCTION/SUBMUCOSAL RESECTION (Bilateral Nose) MAXILLARY ANTROSTOMY (Bilateral ) ETHMOIDECTOMY (Bilateral )  Patient Location: PACU  Anesthesia Type:General  Level of Consciousness: awake, drowsy and patient cooperative  Airway & Oxygen Therapy: Patient Spontanous Breathing and Patient connected to face mask oxygen  Post-op Assessment: Report given to RN and Post -op Vital signs reviewed and stable  Post vital signs: Reviewed and stable  Last Vitals:  Vitals Value Taken Time  BP 154/98 10/16/19 1130  Temp 36.2 C 10/16/19 1130  Pulse 54 10/16/19 1135  Resp 12 10/16/19 1135  SpO2 100 % 10/16/19 1135  Vitals shown include unvalidated device data.  Last Pain:  Vitals:   10/16/19 0802  TempSrc: Temporal  PainSc: 0-No pain         Complications: No complications documented.

## 2019-10-17 ENCOUNTER — Encounter: Payer: Self-pay | Admitting: Unknown Physician Specialty

## 2019-10-17 LAB — SURGICAL PATHOLOGY

## 2020-01-28 ENCOUNTER — Encounter: Payer: Self-pay | Admitting: Podiatry

## 2020-01-28 ENCOUNTER — Ambulatory Visit: Payer: BC Managed Care – PPO | Admitting: Podiatry

## 2020-01-28 ENCOUNTER — Other Ambulatory Visit: Payer: Self-pay

## 2020-01-28 DIAGNOSIS — L603 Nail dystrophy: Secondary | ICD-10-CM

## 2020-01-28 NOTE — Progress Notes (Signed)
Subjective:  Patient ID: Jason Tran, male    DOB: 1965/08/20,  MRN: [id number] HPI Chief Complaint  Patient presents with  . Nail Problem    Toenails-thick, discolored x years, no treatment, tries to keep trimmed  . New Patient (Initial Visit)    54 y.o. male presents with the above complaint.   ROS: Denies fever chills nausea vomiting muscle aches pains calf pain back pain chest pain shortness of breath.  Past Medical History:  Diagnosis Date  . 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 Adrenal adenoma   . Allergic rhinitis, seasonal 12/26/2013  . Arthritis   . 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 BP (high blood pressure) 11/21/2014  . BPH (benign prostatic hyperplasia)   . Chronic prostatitis   . 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 Disorder of kidney 11/21/2014  . Elevated PSA   . GERD (gastroesophageal reflux disease)    H/O  . Gout   . HTN (hypertension)   . Prostatitis 10/21/2014  . Renal disease   . Sleep apnea    DOES NOT USE A CPAP   Past Surgical History:  Procedure Laterality Date  . COLONOSCOPY WITH PROPOFOL N/A 09/15/2015   Procedure: COLONOSCOPY WITH PROPOFOL;  Surgeon: Jason Silvas, MD;  Location: [facility] ENDOSCOPY;  Service: Endoscopy;  Laterality: N/A;  . CYSTOSCOPY    . ETHMOIDECTOMY Bilateral 10/16/2019   Procedure: ETHMOIDECTOMY;  Surgeon: Jason Gust, MD;  Location: [facility] ORS;  Service: ENT;  Laterality: Bilateral;  . IMAGE GUIDED SINUS SURGERY N/A 10/16/2019   Procedure: IMAGE GUIDED SINUS SURGERY;  Surgeon: Jason Gust, MD;  Location: [facility] ORS;  Service: ENT;  Laterality: N/A;  . MAXILLARY ANTROSTOMY Bilateral 10/16/2019   Procedure: MAXILLARY ANTROSTOMY;  Surgeon: Jason Gust, MD;  Location: [facility] ORS;  Service: ENT;  Laterality: Bilateral;  . NASAL TURBINATE REDUCTION Bilateral 10/16/2019   Procedure: TURBINATE REDUCTION/SUBMUCOSAL RESECTION;  Surgeon:  Jason Gust, MD;  Location: [facility] ORS;  Service: ENT;  Laterality: Bilateral;  . SEPTOPLASTY N/A 10/16/2019   Procedure: SEPTOPLASTY;  Surgeon: Jason Gust, MD;  Location: [facility] ORS;  Service: ENT;  Laterality: N/A;    Current Outpatient Medications:  .  allopurinol (ZYLOPRIM) 100 MG tablet, Take 150 mg by mouth every morning. , Disp: , Rfl:  .  eplerenone (INSPRA) 50 MG tablet, Take 50 mg by mouth every morning. , Disp: , Rfl:  .  etodolac (LODINE) 500 MG tablet, Take 500 mg by mouth daily as needed (pain)., Disp: , Rfl:  .  furosemide (LASIX) 20 MG tablet, Take 20 mg by mouth daily. , Disp: , Rfl: 0 .  indomethacin (INDOCIN) 50 MG capsule, Take 50 mg by mouth daily as needed for pain., Disp: , Rfl:  .  tamsulosin (FLOMAX) 0.4 MG CAPS capsule, Take 1 capsule (0.4 mg total) by mouth daily. (Patient taking differently: Take 0.4 mg by mouth daily after breakfast. ), Disp: 90 capsule, Rfl: 3 .  TRIBENZOR 40-10-25 MG TABS, Take 1 tablet by mouth every morning. , Disp: , Rfl: 0 .  vitamin B-12 (CYANOCOBALAMIN) 1000 MCG tablet, Take 1,000 mcg by mouth daily. , Disp: , Rfl:  .  Vitamin D, Ergocalciferol, (DRISDOL) 50000 UNITS CAPS capsule, Take 50,000 Units by mouth every 7 (seven) days. , Disp: , Rfl: 0  No Known Allergies Review of Systems Objective:  There were no vitals filed for this visit.  General: Well developed, nourished, in  no acute distress, alert and oriented x3   Dermatological: Skin is warm, dry and supple bilateral. Nails x 10 are well maintained; remaining integument appears unremarkable at this time. There are no open sores, no preulcerative lesions, no rash or signs of infection present.  [name] onychia to multiple nails some subungual debris and thickening  Vascular: Dorsalis Pedis artery and Posterior Tibial artery pedal pulses are 2/4 bilateral with immedate capillary fill time. Pedal hair growth present. No varicosities and no lower extremity edema present bilateral.    Neruologic: Grossly intact via light touch bilateral. Vibratory intact via tuning fork bilateral. Protective threshold with Semmes Wienstein monofilament intact to all pedal sites bilateral. Patellar and Achilles deep tendon reflexes 2+ bilateral. No Babinski or clonus noted bilateral.   Musculoskeletal: No gross boney pedal deformities bilateral. No pain, crepitus, or limitation noted with foot and ankle range of motion bilateral. Muscular strength 5/5 in all groups tested bilateral.  Gait: Unassisted, Nonantalgic.    Radiographs:  None taken  Assessment & Plan:   Assessment: Nail dystrophy linear [name] onychia  Plan: Samples of the skin and nail were taken today for pathologic evaluation expected to revisit reveal Jason Tran or athlete's foot or onychomycosis.  Follow-up with him in 1 month     Jason Tran, [place]

## 2020-02-25 ENCOUNTER — Encounter: Payer: BC Managed Care – PPO | Admitting: Podiatry

## 2020-04-23 NOTE — Progress Notes (Signed)
4:35 PM   Jason Tran Jul 02, 1965 [id number]  Referring provider: [name], MD [place],  [place] [address]  Chief Complaint  Patient presents with  . Dysuria   Urological history: 1. High risk hematuria - former smoker - CTU 2017 No renal, ureteral or bladder calculi or mass. Multiple benign appearing renal cysts.  Bilateral adrenal gland adenomas. - Cysto 2016 noted mild inflammation of the prostate. - UA negative for micro heme  2. BPH with LU TS - PSA 0.5 in 09/2019 - I PSS 20/3 - Manage with tamulosin 0.4 mg daily   3. Bilateral adrenal adenomas - CT 2019 Stable bilateral adrenal masses, consistent benign adrenal adenomas  HPI: Jason Tran is a 55 y.o. male who presents today for dysuria.    He has been experiencing sharp pain through the penis while urinating for two weeks.  Patient denies any modifying or aggravating factors.  Patient denies any gross hematuria, dysuria or suprapubic/flank pain.  Patient denies any fevers, chills, nausea or vomiting.   He is he is not physically active, but if we want to test for STIs to do so.    He still experiencing postvoid dribbling and a weak urinary stream.  This is baseline urinary symptoms.   His UA is benign.   IPSS    Row Name 04/24/20 1500         International Prostate Symptom Score   How often have you had the sensation of not emptying your bladder? Less than half the time     How often have you had to urinate less than every two hours? About half the time     How often have you found you stopped and started again several times when you urinated? More than half the time     How often have you found it difficult to postpone urination? About half the time     How often have you had a weak urinary stream? Almost always     How often have you had to strain to start urination? Less than half the time     How many times did you typically get up at night to urinate? 1 Time     Total IPSS  Score 20           Quality of Life due to urinary symptoms   If you were to spend the rest of your life with your urinary condition just the way it is now how would you feel about that? Mixed            Score:  1-7 Mild 8-19 Moderate 20-35 Severe   PMH: Past Medical History:  Diagnosis Date  . 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 Adrenal adenoma   . Allergic rhinitis, seasonal 12/26/2013  . Arthritis   . 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 BP (high blood pressure) 11/21/2014  . BPH (benign prostatic hyperplasia)   . Chronic prostatitis   . 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 Disorder of kidney 11/21/2014  . Elevated PSA   . GERD (gastroesophageal reflux disease)    H/O  . Gout   . HTN (hypertension)   . Prostatitis 10/21/2014  . Renal disease   . Sleep apnea    DOES NOT USE A CPAP    Surgical History: Past Surgical History:  Procedure Laterality Date  . COLONOSCOPY WITH PROPOFOL N/A  09/15/2015   Procedure: COLONOSCOPY WITH PROPOFOL;  Surgeon: [name], MD;  Location: [facility] ENDOSCOPY;  Service: Endoscopy;  Laterality: N/A;  . CYSTOSCOPY    . ETHMOIDECTOMY Bilateral 10/16/2019   Procedure: ETHMOIDECTOMY;  Surgeon: Beverly Gust, MD;  Location: [facility] ORS;  Service: ENT;  Laterality: Bilateral;  . IMAGE GUIDED SINUS SURGERY N/A 10/16/2019   Procedure: IMAGE GUIDED SINUS SURGERY;  Surgeon: Beverly Gust, MD;  Location: [facility] ORS;  Service: ENT;  Laterality: N/A;  . MAXILLARY ANTROSTOMY Bilateral 10/16/2019   Procedure: MAXILLARY ANTROSTOMY;  Surgeon: Beverly Gust, MD;  Location: [facility] ORS;  Service: ENT;  Laterality: Bilateral;  . NASAL TURBINATE REDUCTION Bilateral 10/16/2019   Procedure: TURBINATE REDUCTION/SUBMUCOSAL RESECTION;  Surgeon: Beverly Gust, MD;  Location: [facility] ORS;  Service: ENT;  Laterality: Bilateral;  . SEPTOPLASTY N/A 10/16/2019   Procedure: SEPTOPLASTY;   Surgeon: Beverly Gust, MD;  Location: [facility] ORS;  Service: ENT;  Laterality: N/A;    Home Medications:  Allergies as of 04/24/2020   No Known Allergies     Medication List       Accurate as of April 24, 2020 11:59 PM. If you have any questions, ask your nurse or doctor.        allopurinol 100 MG tablet Commonly known as: ZYLOPRIM Take 150 mg by mouth every morning.   carvedilol 12.5 MG tablet Commonly known as: COREG Take 12.5 mg by mouth 2 (two) times daily with a meal.   eplerenone 50 MG tablet Commonly known as: INSPRA Take 50 mg by mouth every morning.   ergocalciferol 1.25 MG (50000 UT) capsule Commonly known as: VITAMIN D2 Take 1 capsule by mouth once a week. What changed: Another medication with the same name was removed. Continue taking this medication, and follow the directions you see here. Changed by: [name], PA-C   etodolac 500 MG tablet Commonly known as: LODINE Take 500 mg by mouth daily as needed (pain).   furosemide 20 MG tablet Commonly known as: LASIX Take 20 mg by mouth daily.   indomethacin 50 MG capsule Commonly known as: INDOCIN Take 50 mg by mouth daily as needed for pain.   isosorbide mononitrate 30 MG 24 hr tablet Commonly known as: IMDUR Take 30 mg by mouth daily.   sulfamethoxazole-trimethoprim 800-160 MG tablet Commonly known as: BACTRIM DS Take 1 tablet by mouth every 12 (twelve) hours. Started by: [name], PA-C   tamsulosin 0.4 MG Caps capsule Commonly known as: FLOMAX Take 1 capsule (0.4 mg total) by mouth daily. What changed: when to take this   Tribenzor 40-10-25 MG Tabs Generic drug: Olmesartan-amLODIPine-HCTZ Take 1 tablet by mouth every morning.   vitamin B-12 1000 MCG tablet Commonly known as: CYANOCOBALAMIN Take 1,000 mcg by mouth daily.       Allergies: No Known Allergies  Family History: Family History  Problem Relation Age of Onset  . Benign prostatic hyperplasia Other   .  Hypertension Other   . Kidney disease Mother   . Kidney disease Maternal Uncle   . Prostate cancer Neg Hx   . Kidney cancer Neg Hx   . Bladder Cancer Neg Hx     Social History:  reports that he quit smoking about 13 years ago. His smoking use included cigarettes. He has a 29.00 pack-year smoking history. He has never used smokeless tobacco. He reports current alcohol use. He reports that he does not use drugs.  ROS: For pertinent review of systems please refer to history of present illness  Physical Exam: BP (!) 177/118   Pulse (!) 54   Ht $R'6\' 2"'sF$  (1.88 m)   Wt 298 lb (135.2 kg)   BMI 38.26 kg/m   Constitutional:  Well nourished. Alert and oriented, No acute distress. HEENT: [place] AT, mask in place.  Trachea midline Cardiovascular: No clubbing, cyanosis, or edema. Respiratory: Normal respiratory effort, no increased work of breathing. Neurologic: Grossly intact, no focal deficits, moving all 4 extremities. Psychiatric: Normal mood and affect.  Laboratory Data: Specimen:  Blood  Ref Range & Units 4 mo ago  Glucose 70 - 110 mg/dL 97   Sodium 136 - 145 mmol/L 138   Potassium 3.6 - 5.1 mmol/L 3.6   Chloride 97 - 109 mmol/L 100   Carbon Dioxide (CO2) 22.0 - 32.0 mmol/L 31.5   Urea Nitrogen (BUN) 7 - 25 mg/dL 15   Creatinine 0.7 - 1.3 mg/dL 1.4High   Glomerular Filtration Rate (eGFR), MDRD Estimate >60 mL/min/1.73sq m 64   Calcium 8.7 - 10.3 mg/dL 9.4   AST  8 - 39 U/L 25   ALT  6 - 57 U/L 28   Alk Phos (alkaline Phosphatase) 34 - 104 U/L 44   Albumin 3.5 - 4.8 g/dL 4.0   Bilirubin, Total 0.3 - 1.2 mg/dL 0.8   Protein, Total 6.1 - 7.9 g/dL 7.0   A/G Ratio 1.0 - 5.0 gm/dL 1.3   Resulting Agency  [facility] - LAB  Specimen Collected: 12/07/19 4:09 PM Last Resulted: 12/10/19 2:24 PM  Received From: [place]  Result Received: 01/17/20 9:58 AM     Specimen:  Blood  Ref Range & Units 4 mo ago  Cholesterol, Total 100 - 200 mg/dL 123    Triglyceride 35 - 199 mg/dL 75   HDL (High Density Lipoprotein) Cholesterol 29.0 - 71.0 mg/dL 45.6   LDL Calculated 0 - 130 mg/dL 62   VLDL Cholesterol mg/dL 15   Cholesterol/HDL Ratio  2.7   Resulting Agency  [facility] - LAB  Specimen Collected: 12/07/19 4:09 PM Last Resulted: 12/10/19 2:24 PM  Received From: [facility]  Result Received: 01/17/20 9:58 AM   Specimen:  Blood  Ref Range & Units 4 mo ago  Hemoglobin A1C 4.2 - 5.6 % 6.2High   Average Blood Glucose (Calc) mg/dL 131   Resulting Agency  [facility] - LAB   Narrative  Normal Range:  4.2 - 5.6%  Increased Risk: 5.7 - 6.4%  Diabetes:    >= 6.5%  Glycemic Control for adults with diabetes: <7%  Specimen Collected: 12/07/19 4:09 PM Last Resulted: 12/10/19 1:21 PM  Received From: [facility]  Result Received: 01/17/20 9:58 AM  I have reviewed the labs.  Pertinent Imaging No recent imaging  Assessment & Plan:    1. Dysuria - UA benign - urine sent for culture as patient is having symptoms - we will also send GC/chlamydia, mycoplasma/Ureaplasma cultures - start on Septra DS empirically until cultures are available and then will adjust if necessary  2. High risk hematuria - Completed CTU in 2017 - no malignancies found - Patient refused follow up cystoscopy, but did undergo a cystoscopy in 2016 which was NED - Urinalysis, Complete - negative for hematuria - may need to consider cystoscopy if cultures as negative  3. BPH with LUTS - IPSS score is 20/3, it is worsening - ? Due to infection - Continue conservative management, avoiding bladder irritants and timed voiding's   Return  for Pending cultures .  These notes generated with voice recognition software. I apologize for typographical errors.  [name], PA-C  [facility] [address] [place], [place] [address] [phone]

## 2020-04-24 ENCOUNTER — Ambulatory Visit (INDEPENDENT_AMBULATORY_CARE_PROVIDER_SITE_OTHER): Payer: BC Managed Care – PPO | Admitting: Urology

## 2020-04-24 ENCOUNTER — Other Ambulatory Visit: Payer: Self-pay

## 2020-04-24 VITALS — BP 177/118 | HR 54 | Ht 74.0 in | Wt 298.0 lb

## 2020-04-24 DIAGNOSIS — N138 Other obstructive and reflux uropathy: Secondary | ICD-10-CM

## 2020-04-24 DIAGNOSIS — N401 Enlarged prostate with lower urinary tract symptoms: Secondary | ICD-10-CM

## 2020-04-24 DIAGNOSIS — R319 Hematuria, unspecified: Secondary | ICD-10-CM | POA: Diagnosis not present

## 2020-04-24 DIAGNOSIS — R300 Dysuria: Secondary | ICD-10-CM

## 2020-04-24 LAB — URINALYSIS, COMPLETE
Bilirubin, UA: NEGATIVE
Glucose, UA: NEGATIVE
Ketones, UA: NEGATIVE
Leukocytes,UA: NEGATIVE
Nitrite, UA: NEGATIVE
Protein,UA: NEGATIVE
Specific Gravity, UA: <1.005 — ABNORMAL LOW (ref 1.005–1.030)
Urobilinogen, Ur: 0.2 mg/dL (ref 0.2–1.0)
pH, UA: 5.5 (ref 5.0–7.5)

## 2020-04-24 LAB — MICROSCOPIC EXAMINATION: Bacteria, UA: NONE SEEN

## 2020-04-24 MED ORDER — SULFAMETHOXAZOLE-TRIMETHOPRIM 800-160 MG PO TABS
1.0000 | ORAL_TABLET | Freq: Two times a day (BID) | ORAL | 0 refills | Status: DC
Start: 2020-04-24 — End: 2022-02-04

## 2020-04-26 LAB — GC/CHLAMYDIA PROBE AMP
Chlamydia trachomatis, NAA: NEGATIVE
Neisseria Gonorrhoeae by PCR: NEGATIVE

## 2020-04-28 ENCOUNTER — Telehealth: Payer: Self-pay | Admitting: Family Medicine

## 2020-04-28 DIAGNOSIS — Z87448 Personal history of other diseases of urinary system: Secondary | ICD-10-CM

## 2020-04-28 LAB — CULTURE, URINE COMPREHENSIVE

## 2020-04-28 NOTE — Telephone Encounter (Signed)
-----   Message from Nori Riis, PA-C sent at 04/28/2020  8:03 AM EST ----- Please let Mr. Kaltenbach know that his cultures have returned negative.  There is no infection.  I would like for him to have a KUB to rule out a stone.

## 2020-04-28 NOTE — Telephone Encounter (Signed)
Patient notified and voiced understanding. A order was placed for KUB. Patient states he will go have the image.

## 2020-05-01 LAB — MYCOPLASMA / UREAPLASMA CULTURE
Mycoplasma hominis Culture: NEGATIVE
Ureaplasma urealyticum: NEGATIVE

## 2020-05-06 ENCOUNTER — Encounter: Payer: Self-pay | Admitting: Urology

## 2020-05-06 ENCOUNTER — Ambulatory Visit
Admission: RE | Admit: 2020-05-06 | Discharge: 2020-05-06 | Disposition: A | Discharge status: Home / Self Care | Payer: BC Managed Care – PPO | Source: Ambulatory Visit | Attending: Urology | Admitting: Urology

## 2020-05-06 ENCOUNTER — Other Ambulatory Visit: Payer: Self-pay

## 2020-05-06 DIAGNOSIS — Z87448 Personal history of other diseases of urinary system: Secondary | ICD-10-CM

## 2020-05-26 ENCOUNTER — Telehealth: Payer: Self-pay | Admitting: Family Medicine

## 2020-05-26 NOTE — Telephone Encounter (Signed)
-----   Message from Nori Riis, PA-C sent at 05/26/2020  9:04 AM EST ----- Please let [name] know that his Xray did not show a stone, but the comments said he was having no symptoms at the time of the Xrays.  Did his burning with urination resolve?

## 2020-05-26 NOTE — Telephone Encounter (Signed)
Patient notified and voiced understanding. Patient states he is not having burning with urination now.

## 2020-10-06 ENCOUNTER — Other Ambulatory Visit: Payer: Self-pay

## 2020-10-06 ENCOUNTER — Other Ambulatory Visit: Payer: BC Managed Care – PPO

## 2020-10-06 DIAGNOSIS — N138 Other obstructive and reflux uropathy: Secondary | ICD-10-CM

## 2020-10-06 DIAGNOSIS — N401 Enlarged prostate with lower urinary tract symptoms: Secondary | ICD-10-CM

## 2020-10-07 LAB — PSA: Prostate Specific Ag, Serum: 0.6 ng/mL (ref 0.0–4.0)

## 2020-10-10 ENCOUNTER — Ambulatory Visit: Payer: Self-pay | Admitting: Urology

## 2020-10-20 NOTE — Progress Notes (Signed)
1:23 PM   Jason Tran Oct 04, 1965 [id number]  Referring provider: [name], MD [place] Jason Tran,   [id number]  Urological history: 1. High risk hematuria - former smoker - CTU 2017 No renal, ureteral or bladder calculi or mass. Multiple benign appearing renal cysts.  Bilateral adrenal gland adenomas. - Cysto 2016 noted mild inflammation of the prostate -no report of gross heme -UA neg for micro heme  2. BPH with LU TS - PSA 0.6 in 09/2020 - I PSS 7/3 - Manage with tamulosin 0.4 mg daily   3. Bilateral adrenal adenomas - CT 2019 Stable bilateral adrenal masses, consistent benign adrenal adenomas  Chief Complaint  Patient presents with   Benign Prostatic Hypertrophy   Hematuria     HPI: Jason Tran is a 55 y.o. male who presents today for yearly follow up.   He is having a weaker stream, but it is not bothersome.  Patient denies any modifying or aggravating factors.  Patient denies any gross hematuria, dysuria or suprapubic/flank pain.  Patient denies any fevers, chills, nausea or vomiting.     IPSS     Row Name 10/21/20 1300         International Prostate Symptom Score   How often have you had the sensation of not emptying your bladder? About half the time     How often have you had to urinate less than every two hours? Less than 1 in 5 times     How often have you found you stopped and started again several times when you urinated? Less than half the time     How often have you found it difficult to postpone urination? Not at All     How often have you had a weak urinary stream? Less than 1 in 5 times     How often have you had to strain to start urination? Not at All     How many times did you typically get up at night to urinate? None     Total IPSS Score 7           Quality of Life due to urinary symptoms     If you were to spend the rest of your life with your urinary condition just the way it is now how would you feel about that?  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   Disorder of kidney 11/21/2014   Elevated PSA    GERD (gastroesophageal reflux disease)    H/O   Gout    HTN (hypertension)    Prostatitis 10/21/2014   Renal disease    Sleep apnea    DOES NOT USE A CPAP    Surgical History: Past Surgical History:  Procedure Laterality Date   COLONOSCOPY WITH PROPOFOL N/A 09/15/2015   Procedure: COLONOSCOPY WITH PROPOFOL;  Surgeon: [name], MD;  Location: [facility];  Service: Endoscopy;  Laterality: N/A;   CYSTOSCOPY  ETHMOIDECTOMY Bilateral 10/16/2019   Procedure: ETHMOIDECTOMY;  Surgeon: Beverly Gust, MD;  Location: [facility] ORS;  Service: ENT;  Laterality: Bilateral;   IMAGE GUIDED SINUS SURGERY N/A 10/16/2019   Procedure: IMAGE GUIDED SINUS SURGERY;  Surgeon: Beverly Gust, MD;  Location: [facility] OR[facility];  Service: ENT;  Laterality: N/A;   MAXILLARY ANTROSTOMY Bilateral 10/16/2019   Procedure: MAXILLARY ANTROSTOMY;  Surgeon: Beverly Gust, MD;  Location: [facility] OR[facility];  Service: ENT;  Laterality: Bilateral;   NASAL TURBINATE REDUCTION Bilateral 10/16/2019   Procedure: TURBINATE REDUCTION/SUBMUCOSAL RESECTION;  Surgeon: Beverly Gust, MD;  Location: [facility] OR[facility];  Service: ENT;  Laterality: Bilateral;   SEPTOPLASTY N/A 10/16/2019   Procedure: SEPTOPLASTY;  Surgeon: Beverly Gust, MD;  Location: [facility] ORS;  Service: ENT;  Laterality: N/A;    Home Medications:  Allergies as of 10/21/2020   No Known Allergies      Medication List        Accurate as of  October 21, 2020  1:23 PM. If you have any questions, ask your nurse or doctor.          allopurinol 100 MG tablet Commonly known as: ZYLOPRIM Take 150 mg by mouth every morning.   amLODipine 10 MG tablet Commonly known as: NORVASC Take 10 mg by mouth daily.   carvedilol 12.5 MG tablet Commonly known as: COREG Take 12.5 mg by mouth 2 (two) times daily with a meal.   chlorthalidone 25 MG tablet Commonly known as: HYGROTON Take 25 mg by mouth every morning.   eplerenone 50 MG tablet Commonly known as: INSPRA Take 50 mg by mouth every morning.   ergocalciferol 1.25 MG (50000 UT) capsule Commonly known as: VITAMIN D2 Take 1 capsule by mouth once a week.   etodolac 500 MG tablet Commonly known as: LODINE Take 500 mg by mouth daily as needed (pain).   furosemide 20 MG tablet Commonly known as: LASIX Take 20 mg by mouth daily.   indomethacin 50 MG capsule Commonly known as: INDOCIN Take 50 mg by mouth daily as needed for pain.   isosorbide mononitrate 30 MG 24 hr tablet Commonly known as: IMDUR Take 30 mg by mouth daily.   olmesartan 40 MG tablet Commonly known as: BENICAR Take 40 mg by mouth daily.   sulfamethoxazole-trimethoprim 800-160 MG tablet Commonly known as: BACTRIM DS Take 1 tablet by mouth every 12 (twelve) hours.   tamsulosin 0.4 MG Caps capsule Commonly known as: FLOMAX Take 1 capsule (0.4 mg total) by mouth daily after breakfast.   Tribenzor 40-10-25 MG Tabs Generic drug: Olmesartan-amLODIPine-HCTZ Take 1 tablet by mouth every morning.   vitamin B-12 1000 MCG tablet Commonly known as: CYANOCOBALAMIN Take 1,000 mcg by mouth daily.        Allergies: No Known Allergies  Family History: Family History  Problem Relation Age of Onset   Benign prostatic hyperplasia Other    Hypertension Other    Kidney disease Mother    Kidney disease Maternal Uncle    Prostate cancer Neg Hx    Kidney cancer Neg Hx    Bladder Cancer Neg Hx     Social  History:  reports that he quit smoking about 13 years ago. His smoking use included cigarettes. He has a 29.00 pack-year smoking history. He has never used smokeless tobacco. He reports current alcohol use. He reports that he does not use drugs.  ROS: For pertinent review of systems please refer to history of present illness  Physical Exam: BP 137/84   Pulse (!) 56  Ht _0  (1.88 m)   Wt (!) 302 lb (137 kg)   BMI 38.77 kg/m   Constitutional:  Well nourished. Alert and oriented, No acute distress. HEENT: [place] AT, mask in place.  Trachea midline Cardiovascular: No clubbing, cyanosis, or edema. Respiratory: Normal respiratory effort, no increased work of breathing. GU: No CVA tenderness.  No bladder fullness or masses.  Patient with uncircumcised phallus. Foreskin easily retracted  Urethral meatus is patent.  No penile discharge. No penile lesions or rashes. Scrotum without lesions, cysts, rashes and/or edema.  Testicles are located scrotally bilaterally. No masses are appreciated in the testicles. Left and right epididymis are normal. Rectal: Patient with  normal sphincter tone. Anus and perineum without scarring or rashes. No rectal masses are appreciated. Prostate is approximately 50 grams, could only palpate the apex and the midportion of the gland, no nodules are appreciated. Seminal vesicles could not be palpated Lymph: No inguinal adenopathy. Neurologic: Grossly intact, no focal deficits, moving all 4 extremities. Psychiatric: Normal mood and affect.   Laboratory Data: SARS-CoV2 XP Negative Positive Abnormal    Resulting Agency  [facility] - LAB   Narrative Performed by [facility] - [facility]  Positive results are indicative of active infection with SARS-CoV-2; clinical correlation with patient history and other diagnostic information is necessary to determine patient infection status. Positive results do not rule out bacterial infection or co-infection with other  viruses.   This test has not been FDA cleared or approved. This test has been authorized by FDA under an Emergency Use Authorization (EUA). This test is only authorized for the duration of the declaration that circumstances exist justifying the authorization of emergency use of in vitro diagnostics for detection and/or diagnosis of COVID-19 under Section 564(b) (1) of the Act, 21 U.S.C. 119ERD-4(Y) (1), unless the authorization is terminated or revoked sooner. Specimen Collected: 08/14/20 15:32 Last Resulted: 08/14/20 16:17  Received From: [facility]  Result Received: 08/25/20 14:10   Sodium 135 - 145 mmol/L 141   Potassium 3.4 - 4.8 mmol/L 3.9   Chloride 98 - 107 mmol/L 104   CO2 20.0 - 31.0 mmol/L 28.0   Anion Gap 5 - 14 mmol/L 9   BUN 9 - 23 mg/dL 13   Creatinine 0.60 - 1.10 mg/dL 1.23 High    BUN/Creatinine Ratio  11   EGFR CKD-EPI Non-African American, Male >=60 mL/min/1.67m 66   EGFR CKD-EPI African American, Male >=60 mL/min/1.791m76   Glucose 70 - 179 mg/dL 101   Calcium 8.7 - 10.4 mg/dL 9.8   Phosphorus 2.4 - 5.1 mg/dL 3.7   Albumin 3.4 - 5.0 g/dL 3.7   [facility]ing Agency  [facility]  Specimen Collected: 07/04/20 09:09 Last Resulted: 07/04/20 17:04  Received From: [facility] Received: 08/25/20 14:10   WBC 3.6 - 11.2 10*9/L 4.7   RBC 4.26 - 5.60 10*12/L 5.23   HGB 12.9 - 16.5 g/dL 15.6   HCT 39.0 - 48.0 % 45.1   MCV 77.6 - 95.7 fL 86.3   MCH 25.9 - 32.4 pg 29.9   MCHC 32.0 - 36.0 g/dL 34.6   RDW 12.2 - 15.2 % 13.3   MPV 6.8 - 10.7 fL 7.8   Platelet 150 - 450 10*9/L 185   Resulting Agency  [facility]  Specimen Collected: 07/04/20 09:09 Last Resulted: 07/04/20 16:35  Received From: [facility] Received: 08/25/20 14:10   Hemoglobin A1C 4.8 -  5.6 % 5.9 High    Estimated Average Glucose mg/dL [place]   Narrative Performed by  [facility] Screening or Diagnosis of Diabetes Mellitus*  A1c Reference Interval        Interpretation  4.8 - 5.6                     Normal  5.7 - 6.4                     Dysglycemia  >6.4                          Diabetes Mellitus   *Not recommended for diagnosis of diabetes in children with Cystic Fibrosis or with symptoms suggestive of acute onset type 1 diabetes.    A1c Glycemic Goal: <7.0 %   **Goals should be individualized; more or less stringent A1c glycemic goals may be appropriate for individual patients.  (Adopted from: 2020 ADA Standards of Medical Care In Diabetes)  Specimen Collected: 07/04/20 09:09 Last Resulted: 07/04/20 19:15  Received From: [facility]  Result Received: 08/25/20 14:10   Uric Acid 3.7 - 9.2 mg/dL 6.5   Resulting Agency  [facility]  Specimen Collected: 07/04/20 09:09 Last Resulted: 07/04/20 17:00  Received From: [facility]  Result Received: 08/25/20 14:10  Urinalysis Component     Latest Ref Rng & Units 10/21/2020  Specific Gravity, UA     1.005 - 1.030 1.020  pH, UA     5.0 - 7.5 5.0  Color, UA     Yellow Yellow  Appearance Ur     Clear Clear  Leukocytes,UA     Negative Negative  Protein,UA     Negative/Trace Negative  Glucose, UA     Negative Negative  Ketones, UA     Negative Negative  RBC, UA     Negative Negative  Bilirubin, UA     Negative Negative  Urobilinogen, Ur     0.2 - 1.0 mg/dL 0.2  Nitrite, UA     Negative Negative  Microscopic Examination      See below:   Component     Latest Ref Rng & Units 10/21/2020  WBC, UA     0 - 5 /hpf 0-5  RBC     0 - 2 /hpf None seen  Epithelial Cells (non renal)     0 - 10 /hpf 0-10  Bacteria, UA     None seen/Few None seen   I have reviewed the labs.  Pertinent Imaging No recent imaging  Assessment & Plan:    1. BPH with LUTS - PSA stable - DRE normal - continue tamsulosin 0.4 mg daily-refilled sent to  pharmacy  2. High risk hematuria - Completed CTU in 2017 - no malignancies found - Patient refused follow up cystoscopy after 2017 CTU,  but did undergo a cystoscopy in 2016 which was NED - no reports of gross heme -UA neg for micro heme -RTC in one year for UA - to report any gross heme in the meantime   Return in about 1 year (around 10/21/2021) for IPSS, PSA, UA and exam.  These notes generated with voice recognition software. I apologize for typographical errors.  [name], PA-C  [facility] [address] [place], [place] [address] [phone]

## 2020-10-21 ENCOUNTER — Encounter: Payer: Self-pay | Admitting: Urology

## 2020-10-21 ENCOUNTER — Ambulatory Visit: Payer: BC Managed Care – PPO | Admitting: Urology

## 2020-10-21 ENCOUNTER — Other Ambulatory Visit: Payer: Self-pay

## 2020-10-21 VITALS — BP 137/84 | HR 56 | Ht 74.0 in | Wt 302.0 lb

## 2020-10-21 DIAGNOSIS — N138 Other obstructive and reflux uropathy: Secondary | ICD-10-CM

## 2020-10-21 DIAGNOSIS — R319 Hematuria, unspecified: Secondary | ICD-10-CM | POA: Diagnosis not present

## 2020-10-21 DIAGNOSIS — N401 Enlarged prostate with lower urinary tract symptoms: Secondary | ICD-10-CM

## 2020-10-21 LAB — URINALYSIS, COMPLETE
Bilirubin, UA: NEGATIVE
Glucose, UA: NEGATIVE
Ketones, UA: NEGATIVE
Leukocytes,UA: NEGATIVE
Nitrite, UA: NEGATIVE
Protein,UA: NEGATIVE
RBC, UA: NEGATIVE
Specific Gravity, UA: 1.02 (ref 1.005–1.030)
Urobilinogen, Ur: 0.2 mg/dL (ref 0.2–1.0)
pH, UA: 5 (ref 5.0–7.5)

## 2020-10-21 LAB — MICROSCOPIC EXAMINATION
Bacteria, UA: NONE SEEN
RBC, Urine: NONE SEEN /hpf (ref 0–2)

## 2020-10-21 MED ORDER — TAMSULOSIN HCL 0.4 MG PO CAPS: 0.4000 mg | ORAL_CAPSULE | Freq: Every day | ORAL | 3 refills | Status: DC

## 2020-10-24 ENCOUNTER — Other Ambulatory Visit: Payer: Self-pay | Admitting: Urology

## 2020-10-24 DIAGNOSIS — R319 Hematuria, unspecified: Secondary | ICD-10-CM

## 2020-10-24 DIAGNOSIS — N138 Other obstructive and reflux uropathy: Secondary | ICD-10-CM

## 2021-10-15 ENCOUNTER — Other Ambulatory Visit: Payer: BC Managed Care – PPO

## 2021-10-15 DIAGNOSIS — N138 Other obstructive and reflux uropathy: Secondary | ICD-10-CM

## 2021-10-16 LAB — PSA: Prostate Specific Ag, Serum: 0.6 ng/mL (ref 0.0–4.0)

## 2021-10-22 ENCOUNTER — Ambulatory Visit: Payer: Self-pay | Admitting: Urology

## 2021-10-27 ENCOUNTER — Other Ambulatory Visit: Payer: Self-pay | Admitting: Urology

## 2021-10-27 DIAGNOSIS — N401 Enlarged prostate with lower urinary tract symptoms: Secondary | ICD-10-CM

## 2021-11-02 NOTE — Progress Notes (Unsigned)
4:46 PM   Jason Tran 1965-09-26 [id number]  Referring provider: [name], MD [place],  [place] [address]  Urological history: 1. High risk hematuria - former smoker - CTU 2017 No renal, ureteral or bladder calculi or mass. Multiple benign appearing renal cysts.  Bilateral adrenal gland adenomas. - Cysto 2016 noted mild inflammation of the prostate -no report of gross heme -UA neg for micro heme  2. BPH with LU TS - PSA, 09/2021 - 0.6 - I PSS 10/3 - tamulosin 0.4 mg daily   3. Bilateral adrenal adenomas - CT 2019 Stable bilateral adrenal masses, consistent benign adrenal adenomas  Chief Complaint  Patient presents with   Benign Prostatic Hypertrophy    HPI: Jason Tran is a 56 y.o. male who presents today for yearly follow up.   He continues to have issues with a weak urinary stream, intermittency and hesitancy.  Patient denies any modifying or aggravating factors.  Patient denies any gross hematuria, dysuria or suprapubic/flank pain.  Patient denies any fevers, chills, nausea or vomiting.    PVR 135 mL   IPSS     Row Name 11/03/21 1600         International Prostate Symptom Score   How often have you had the sensation of not emptying your bladder? Less than half the time     How often have you had to urinate less than every two hours? Less than 1 in 5 times     How often have you found you stopped and started again several times when you urinated? About half the time     How often have you found it difficult to postpone urination? Not at All     How often have you had a weak urinary stream? Less than 1 in 5 times     How often have you had to strain to start urination? Less than 1 in 5 times     How many times did you typically get up at night to urinate? 2 Times     Total IPSS Score 10       Quality of Life due to urinary symptoms   If you were to spend the rest of your life with your urinary condition just the way it is now how  would you feel about that? 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   Disorder of kidney 11/21/2014   Elevated PSA    GERD (gastroesophageal reflux disease)    H/O   Gout    HTN (hypertension)    Prostatitis 10/21/2014   Renal disease    Sleep apnea    DOES NOT USE A CPAP    Surgical History: Past Surgical History:  Procedure Laterality Date   COLONOSCOPY WITH PROPOFOL N/A 09/15/2015   Procedure: COLONOSCOPY WITH PROPOFOL;  Surgeon: [name], MD;  Location: [facility];  Service: Endoscopy;  Laterality: N/A;   CYSTOSCOPY  ETHMOIDECTOMY Bilateral 10/16/2019   Procedure: ETHMOIDECTOMY;  Surgeon: Beverly Gust, MD;  Location: [facility];  Service: ENT;  Laterality: Bilateral;   IMAGE GUIDED SINUS SURGERY N/A 10/16/2019   Procedure: IMAGE GUIDED SINUS SURGERY;  Surgeon: Beverly Gust, MD;  Location: [facility];  Service: ENT;  Laterality: N/A;   MAXILLARY ANTROSTOMY Bilateral 10/16/2019   Procedure: MAXILLARY ANTROSTOMY;  Surgeon: Beverly Gust, MD;  Location: [facility];  Service: ENT;  Laterality: Bilateral;   NASAL TURBINATE REDUCTION Bilateral 10/16/2019   Procedure: TURBINATE REDUCTION/SUBMUCOSAL RESECTION;  Surgeon: Beverly Gust, MD;  Location: [facility];  Service: ENT;  Laterality: Bilateral;   SEPTOPLASTY N/A 10/16/2019   Procedure: SEPTOPLASTY;  Surgeon: Beverly Gust, MD;  Location: [facility];  Service: ENT;  Laterality: N/A;    Home Medications:  Allergies as of 11/03/2021   No Known Allergies      Medication  List        Accurate as of November 03, 2021  4:46 PM. If you have any questions, ask your nurse or doctor.          allopurinol 100 MG tablet Commonly known as: ZYLOPRIM Take 150 mg by mouth every morning.   amLODipine 10 MG tablet Commonly known as: NORVASC Take 10 mg by mouth daily.   carvedilol 12.5 MG tablet Commonly known as: COREG Take 12.5 mg by mouth 2 (two) times daily with a meal.   chlorthalidone 25 MG tablet Commonly known as: HYGROTON Take 25 mg by mouth every morning.   cyanocobalamin 1000 MCG tablet Commonly known as: VITAMIN B12 Take 1,000 mcg by mouth daily.   eplerenone 50 MG tablet Commonly known as: INSPRA Take 50 mg by mouth every morning.   ergocalciferol 1.25 MG (50000 UT) capsule Commonly known as: VITAMIN D2 Take 1 capsule by mouth once a week.   etodolac 500 MG tablet Commonly known as: LODINE Take 500 mg by mouth daily as needed (pain).   finasteride 5 MG tablet Commonly known as: PROSCAR Take 1 tablet (5 mg total) by mouth daily. Started by: [name], PA-C   furosemide 20 MG tablet Commonly known as: LASIX Take 20 mg by mouth daily.   indomethacin 50 MG capsule Commonly known as: INDOCIN Take 50 mg by mouth daily as needed for pain.   isosorbide mononitrate 30 MG 24 hr tablet Commonly known as: IMDUR Take 30 mg by mouth daily.   olmesartan 40 MG tablet Commonly known as: BENICAR Take 40 mg by mouth daily.   sulfamethoxazole-trimethoprim 800-160 MG tablet Commonly known as: BACTRIM DS Take 1 tablet by mouth every 12 (twelve) hours.   tamsulosin 0.4 MG Caps capsule Commonly known as: FLOMAX Take 2 capsules (0.8 mg total) by mouth daily. What changed: See the new instructions. Changed by: [name], PA-C   Tribenzor 40-10-25 MG Tabs Generic drug: Olmesartan-amLODIPine-HCTZ Take 1 tablet by mouth every morning.        Allergies: No Known Allergies  Family History: Family History  Problem Relation  Age of Onset   Benign prostatic hyperplasia Other    Hypertension Other    Kidney disease Mother    Kidney disease Maternal Uncle    Prostate cancer Neg Hx    Kidney cancer Neg Hx    Bladder Cancer Neg Hx     Social History:  reports that he quit smoking about 14 years ago. His smoking use included cigarettes. He has a 29.00 pack-year smoking history. He has never used smokeless tobacco. He reports current alcohol  use. He reports that he does not use drugs.  ROS: For pertinent review of systems please refer to history of present illness  Physical Exam: BP (!) 173/105   Pulse (!) 54   Ht 6' 2" (1.88 m)   Wt (!) 313 lb (142 kg)   BMI 40.19 kg/m   Constitutional:  Well nourished. Alert and oriented, No acute distress. HEENT:  AT, moist mucus membranes.  Trachea midline Cardiovascular: No clubbing, cyanosis, or edema. Respiratory: Normal respiratory effort, no increased work of breathing. GU: No CVA tenderness.  No bladder fullness or masses.  Patient with uncircumcised phallus. Foreskin easily retracted  Urethral meatus is patent.  No penile discharge. No penile lesions or rashes. Scrotum without lesions, cysts, rashes and/or edema.  Testicles are located scrotally bilaterally. No masses are appreciated in the testicles. Left and right epididymis are normal. Rectal: Patient with  normal sphincter tone. Anus and perineum without scarring or rashes. No rectal masses are appreciated. Prostate is approximately 50 grams, could only palpate the apex, no nodules are appreciated. Seminal vesicles could not be palpated Neurologic: Grossly intact, no focal deficits, moving all 4 extremities. Psychiatric: Normal mood and affect.   Laboratory Data:  Prostate Specific Ag, Serum  Latest Ref Rng 0.0 - 4.0 ng/mL  09/05/2017 0.5   09/18/2018 0.5   09/27/2019 0.5   10/06/2020 0.6   10/15/2021 0.6    Glucose 70 - 110 mg/dL 118 High    Sodium 136 - 145 mmol/L 139   Potassium 3.6 - 5.1 mmol/L 4.1    Chloride 97 - 109 mmol/L 103   Carbon Dioxide (CO2) 22.0 - 32.0 mmol/L 31.2   Calcium 8.7 - 10.3 mg/dL 9.5   Urea Nitrogen (BUN) 7 - 25 mg/dL 22   Creatinine 0.7 - 1.3 mg/dL 1.4 High    Glomerular Filtration Rate (eGFR), MDRD Estimate >60 mL/min/1.73sq m 64   BUN/Crea Ratio 6.0 - 20.0 15.7   Anion Gap w/K 6.0 - 16.0 8.9   Resulting Agency  [facility] - [facility]   Specimen Collected: 05/06/21 08:32   Performed by: [place]: 05/06/21 14:17  Received From: [facility]  Result Received: 10/15/21 07:48   WBC (White Blood Cell Count) 4.1 - 10.2 10^3/uL 4.9   RBC (Red Blood Cell Count) 4.69 - 6.13 10^6/uL 5.65   Hemoglobin 14.1 - 18.1 gm/dL 16.7   Hematocrit 40.0 - 52.0 % 49.1   MCV (Mean Corpuscular Volume) 80.0 - 100.0 fl 86.9   MCH (Mean Corpuscular Hemoglobin) 27.0 - 31.2 pg 29.6   MCHC (Mean Corpuscular Hemoglobin Concentration) 32.0 - 36.0 gm/dL 34.0   Platelet Count 150 - 450 10^3/uL 194   RDW-CV (Red Cell Distribution Width) 11.6 - 14.8 % 12.2   MPV (Mean Platelet Volume) 9.4 - 12.4 fl 9.3 Low    Neutrophils 1.50 - 7.80 10^3/uL 2.50   Lymphocytes 1.00 - 3.60 10^3/uL 1.90   Mixed Count 0.10 - 0.90 10^3/uL 0.50   Neutrophil % 32.0 - 70.0 % 52.6   Lymphocyte % 10.0 - 50.0 % 38.0   Mixed % 3.0 - 14.4 % 9.4   Resulting Agency  [facility] - [facility]   Specimen Collected: 04/14/21 08:02   Performed by: [facility] Last Resulted: 04/14/21 09:11  Received From: [facility]  Result Received: 10/15/21 07:48   Cholesterol, Total 100 - 200 mg/dL 142   Triglyceride 35 - 199 mg/dL 153  HDL (High Density Lipoprotein) Cholesterol 29.0 - 71.0 mg/dL 50.4   LDL Calculated 0 - 130 mg/dL 61   VLDL Cholesterol mg/dL 31   Cholesterol/HDL Ratio  2.8   Resulting Agency  [facility] - [facility]   Specimen Collected: 04/14/21 08:02   Performed by: [facility]: 04/14/21  15:45  Received From: [place] [facility]  Result Received: 10/15/21 07:48   Thyroid Stimulating Hormone (TSH) 0.450-5.330 uIU/ml uIU/mL 2.[place]   Specimen Collected: 04/14/21 08:02   Performed by: [facility] Last Resulted: 04/14/21 15:37  Received From: [facility]  Result Received: 10/15/21 07:48   Hemoglobin A1C 4.2 - 5.6 % 6.7 High    Average Blood Glucose (Calc) mg/dL [facility] - LAB  Narrative Performed by [facility] - LAB Normal Range:    4.2 - 5.6%  Increased Risk:  5.7 - 6.4%  Diabetes:        >= 6.5%  Glycemic Control for adults with diabetes:  <7%    Specimen Collected: 04/14/21 08:02   Performed by: [place]: 04/14/21 13:15  Received From: [facility]  Result Received: 10/15/21 07:48  I have reviewed the labs.  Pertinent Imaging No recent imaging  Assessment & Plan:    1. BPH with LUTS - PSA stable - DRE normal -Continues to have issues with obstructive symptoms -We will increase tamsulosin to 0.4 mg twice daily -Discussed undergoing evaluation to see if he is a candidate for a bladder outlet procedure, but he deferred at this time -We will initiate finasteride 5 mg daily-advised of side effects  2. High risk hematuria - Completed CTU in 2017 - no malignancies found - Patient refused follow up cystoscopy after 2017 CTU,  but did undergo a cystoscopy in 2016 which was NED - no reports of gross heme -Could not provide UA at this visit, but he will when he returns in 3 months  Return for UA, I PSS and PVR .  These notes generated with voice recognition software. I apologize for typographical errors.  [name], PA-C  [facility] [address] [place], [place] [address] [phone]

## 2021-11-03 ENCOUNTER — Ambulatory Visit: Payer: BC Managed Care – PPO | Admitting: Urology

## 2021-11-03 ENCOUNTER — Encounter: Payer: Self-pay | Admitting: Urology

## 2021-11-03 VITALS — BP 173/105 | HR 54 | Ht 74.0 in | Wt 313.0 lb

## 2021-11-03 DIAGNOSIS — N401 Enlarged prostate with lower urinary tract symptoms: Secondary | ICD-10-CM

## 2021-11-03 DIAGNOSIS — N138 Other obstructive and reflux uropathy: Secondary | ICD-10-CM

## 2021-11-03 DIAGNOSIS — R319 Hematuria, unspecified: Secondary | ICD-10-CM

## 2021-11-03 LAB — BLADDER SCAN AMB NON-IMAGING: Scan Result: 135

## 2021-11-03 MED ORDER — FINASTERIDE 5 MG PO TABS: 5.0000 mg | ORAL_TABLET | Freq: Every day | ORAL | 3 refills | Status: AC

## 2021-11-03 MED ORDER — TAMSULOSIN HCL 0.4 MG PO CAPS: 0.8000 mg | ORAL_CAPSULE | Freq: Every day | ORAL | 3 refills | Status: AC

## 2021-11-03 NOTE — Addendum Note (Signed)
Addended by: Kyra Manges on: 11/03/2021 04:51 PM   Modules accepted: Orders

## 2021-12-01 ENCOUNTER — Other Ambulatory Visit: Payer: Self-pay | Admitting: Family Medicine

## 2021-12-02 LAB — CMP14+LP+CBC/D/PLT+HB A1C
ALT: 27 IU/L (ref 0–44)
AST: 21 IU/L (ref 0–40)
Albumin/Globulin Ratio: 1.5 (ref 1.2–2.2)
Albumin: 4.1 g/dL (ref 3.8–4.9)
Alkaline Phosphatase: 69 IU/L (ref 44–121)
BUN/Creatinine Ratio: 13 (ref 9–20)
BUN: 19 mg/dL (ref 6–24)
Basophils Absolute: 0 10*3/uL (ref 0.0–0.2)
Basos: 0 %
Bilirubin Total: 0.3 mg/dL (ref 0.0–1.2)
CO2: 19 mmol/L — ABNORMAL LOW (ref 20–29)
Calcium: 8.8 mg/dL (ref 8.7–10.2)
Chloride: 102 mmol/L (ref 96–106)
Cholesterol, Total: 132 mg/dL (ref 100–199)
Creatinine, Ser: 1.45 mg/dL — ABNORMAL HIGH (ref 0.76–1.27)
EOS (ABSOLUTE): 0.3 10*3/uL (ref 0.0–0.4)
Eos: 6 %
Globulin, Total: 2.7 g/dL (ref 1.5–4.5)
Glucose: 156 mg/dL — ABNORMAL HIGH (ref 70–99)
HDL: 52 mg/dL (ref >39)
Hematocrit: 48.2 % (ref 37.5–51.0)
Hemoglobin: 15.9 g/dL (ref 13.0–17.7)
Hgb A1c MFr Bld: 6.4 % — ABNORMAL HIGH (ref 4.8–5.6)
Immature Grans (Abs): 0 10*3/uL (ref 0.0–0.1)
Immature Granulocytes: 0 %
LDL Chol Calc (NIH): 61 mg/dL (ref 0–99)
Lymphocytes Absolute: 1.4 10*3/uL (ref 0.7–3.1)
Lymphs: 31 %
MCH: 29.6 pg (ref 26.6–33.0)
MCHC: 33 g/dL (ref 31.5–35.7)
MCV: 90 fL (ref 79–97)
Monocytes Absolute: 0.4 10*3/uL (ref 0.1–0.9)
Monocytes: 10 %
Neutrophils Absolute: 2.3 10*3/uL (ref 1.4–7.0)
Neutrophils: 53 %
Platelets: 187 10*3/uL (ref 150–450)
Potassium: 3.9 mmol/L (ref 3.5–5.2)
RBC: 5.38 x10E6/uL (ref 4.14–5.80)
RDW: 13.3 % (ref 11.6–15.4)
Sodium: 138 mmol/L (ref 134–144)
Total Protein: 6.8 g/dL (ref 6.0–8.5)
Triglycerides: 106 mg/dL (ref 0–149)
VLDL Cholesterol Cal: 19 mg/dL (ref 5–40)
WBC: 4.5 10*3/uL (ref 3.4–10.8)
eGFR: 57 mL/min/{1.73_m2} — ABNORMAL LOW (ref >59)

## 2021-12-02 LAB — PSA: Prostate Specific Ag, Serum: 0.4 ng/mL (ref 0.0–4.0)

## 2022-02-01 ENCOUNTER — Other Ambulatory Visit: Payer: Self-pay | Admitting: Urology

## 2022-02-02 LAB — PSA: Prostate Specific Ag, Serum: 0.4 ng/mL (ref 0.0–4.0)

## 2022-02-02 NOTE — Progress Notes (Deleted)
4:46 PM   Lauri Till 08/31/1965 [id number]  Referring provider: [name], MD [place],   [id number]  Urological history: 1. High risk hematuria - former smoker - CTU 2017 No renal, ureteral or bladder calculi or mass. Multiple benign appearing renal cysts.  Bilateral adrenal gland adenomas. - Cysto 2016 noted mild inflammation of the prostate -no report of gross heme -UA ***  2. BPH with LU TS - PSA, 09/2021 - 0.6 - I PSS *** - PVR *** - tamulosin 0.4 mg twice daily   3. Bilateral adrenal adenomas - CT 2019 Stable bilateral adrenal masses, consistent benign adrenal adenomas  Chief Complaint  Patient presents with   Benign Prostatic Hypertrophy    HPI: Jason Tran is a 56 y.o. male who presents today for yearly follow up.   At his visit on 11/03/2021, he continued to have issues with a weak urinary stream, intermittency and hesitancy.  PVR 135 mL   IPSS     Row Name 11/03/21 1600         International Prostate Symptom Score   How often have you had the sensation of not emptying your bladder? Less than half the time     How often have you had to urinate less than every two hours? Less than 1 in 5 times     How often have you found you stopped and started again several times when you urinated? About half the time     How often have you found it difficult to postpone urination? Not at All     How often have you had a weak urinary stream? Less than 1 in 5 times     How often have you had to strain to start urination? Less than 1 in 5 times     How many times did you typically get up at night to urinate? 2 Times     Total IPSS Score 10       Quality of Life due to urinary symptoms   If you were to spend the rest of your life with your urinary condition just the way it is now how would you feel about that? 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   Disorder of kidney 11/21/2014   Elevated PSA    GERD (gastroesophageal reflux disease)    H/O   Gout    HTN (hypertension)    Prostatitis 10/21/2014   Renal disease    Sleep apnea    DOES NOT USE A CPAP    Surgical History: Past Surgical History:  Procedure Laterality Date   COLONOSCOPY WITH PROPOFOL N/A 09/15/2015   Procedure: COLONOSCOPY WITH PROPOFOL;  Surgeon: [name], MD;  Location: [facility];  Service: Endoscopy;  Laterality: N/A;   CYSTOSCOPY     ETHMOIDECTOMY Bilateral 10/16/2019   Procedure: ETHMOIDECTOMY;  Surgeon: Beverly Gust, MD;  Location: [facility];  Service: ENT;  Laterality:  Bilateral;   IMAGE GUIDED SINUS SURGERY N/A 10/16/2019   Procedure: IMAGE GUIDED SINUS SURGERY;  Surgeon: Beverly Gust, MD;  Location: [facility];  Service: ENT;  Laterality: N/A;   MAXILLARY ANTROSTOMY Bilateral 10/16/2019   Procedure: MAXILLARY ANTROSTOMY;  Surgeon: Beverly Gust, MD;  Location: [facility];  Service: ENT;  Laterality: Bilateral;   NASAL TURBINATE REDUCTION Bilateral 10/16/2019   Procedure: TURBINATE REDUCTION/SUBMUCOSAL RESECTION;  Surgeon: Beverly Gust, MD;  Location: [facility];  Service: ENT;  Laterality: Bilateral;   SEPTOPLASTY N/A 10/16/2019   Procedure: SEPTOPLASTY;  Surgeon: Beverly Gust, MD;  Location: [facility];  Service: ENT;  Laterality: N/A;    Home Medications:  Allergies as of 11/03/2021   No Known Allergies      Medication List        Accurate as of November 03, 2021  4:46 PM. If you have any questions, ask your nurse or doctor.          allopurinol 100 MG tablet Commonly  known as: ZYLOPRIM Take 150 mg by mouth every morning.   amLODipine 10 MG tablet Commonly known as: NORVASC Take 10 mg by mouth daily.   carvedilol 12.5 MG tablet Commonly known as: COREG Take 12.5 mg by mouth 2 (two) times daily with a meal.   chlorthalidone 25 MG tablet Commonly known as: HYGROTON Take 25 mg by mouth every morning.   cyanocobalamin 1000 MCG tablet Commonly known as: VITAMIN B12 Take 1,000 mcg by mouth daily.   eplerenone 50 MG tablet Commonly known as: INSPRA Take 50 mg by mouth every morning.   ergocalciferol 1.25 MG (50000 UT) capsule Commonly known as: VITAMIN D2 Take 1 capsule by mouth once a week.   etodolac 500 MG tablet Commonly known as: LODINE Take 500 mg by mouth daily as needed (pain).   finasteride 5 MG tablet Commonly known as: PROSCAR Take 1 tablet (5 mg total) by mouth daily. Started by: [name], PA-C   furosemide 20 MG tablet Commonly known as: LASIX Take 20 mg by mouth daily.   indomethacin 50 MG capsule Commonly known as: INDOCIN Take 50 mg by mouth daily as needed for pain.   isosorbide mononitrate 30 MG 24 hr tablet Commonly known as: IMDUR Take 30 mg by mouth daily.   olmesartan 40 MG tablet Commonly known as: BENICAR Take 40 mg by mouth daily.   sulfamethoxazole-trimethoprim 800-160 MG tablet Commonly known as: BACTRIM DS Take 1 tablet by mouth every 12 (twelve) hours.   tamsulosin 0.4 MG Caps capsule Commonly known as: FLOMAX Take 2 capsules (0.8 mg total) by mouth daily. What changed: See the new instructions. Changed by: [name], PA-C   Tribenzor 40-10-25 MG Tabs Generic drug: Olmesartan-amLODIPine-HCTZ Take 1 tablet by mouth every morning.        Allergies: No Known Allergies  Family History: Family History  Problem Relation Age of Onset   Benign prostatic hyperplasia Other    Hypertension Other    Kidney disease Mother    Kidney disease Maternal Uncle    Prostate cancer Neg Hx     Kidney cancer Neg Hx    Bladder Cancer Neg Hx     Social History:  reports that he quit smoking about 14 years ago. His smoking use included cigarettes. He has a 29.00 pack-year smoking history. He has never used smokeless tobacco. He reports current alcohol use. He reports that he does not use drugs.  ROS: For pertinent review of systems please refer to history of  present illness  Physical Exam: BP (!) 173/105   Pulse (!) 54   Ht '6\' 2"'$  (1.88 m)   Wt (!) 313 lb (142 kg)   BMI 40.19 kg/m   Constitutional:  Well nourished. Alert and oriented, No acute distress. HEENT: [place] AT, moist mucus membranes.  Trachea midline Cardiovascular: No clubbing, cyanosis, or edema. Respiratory: Normal respiratory effort, no increased work of breathing. Neurologic: Grossly intact, no focal deficits, moving all 4 extremities. Psychiatric: Normal mood and affect.   Laboratory data: N/A I have reviewed the labs.  Pertinent Imaging ***  Assessment & Plan:    1. BPH with LUTS - PSA stable - DRE normal -Continues to have issues with obstructive symptoms -We will increase tamsulosin to 0.4 mg twice daily -Discussed undergoing evaluation to see if he is a candidate for a bladder outlet procedure, but he deferred at this time -We will initiate finasteride 5 mg daily-advised of side effects  2. High risk hematuria - Completed CTU in 2017 - no malignancies found - Patient refused follow up cystoscopy after 2017 CTU,  but did undergo a cystoscopy in 2016 which was NED - no reports of gross heme -Could not provide UA at this visit, but he will when he returns in 3 months  Return for UA, I PSS and PVR .  These notes generated with voice recognition software. I apologize for typographical errors.  [place], [place] [address] [place], [place] [address] [phone]

## 2022-02-03 ENCOUNTER — Ambulatory Visit: Payer: BC Managed Care – PPO | Admitting: Urology

## 2022-02-03 DIAGNOSIS — R319 Hematuria, unspecified: Secondary | ICD-10-CM

## 2022-02-03 DIAGNOSIS — N138 Other obstructive and reflux uropathy: Secondary | ICD-10-CM

## 2022-02-04 ENCOUNTER — Ambulatory Visit: Payer: BC Managed Care – PPO | Admitting: Urology

## 2022-02-04 ENCOUNTER — Telehealth: Payer: Self-pay

## 2022-02-04 VITALS — BP 170/102 | HR 57 | Ht 74.0 in | Wt 323.0 lb

## 2022-02-04 DIAGNOSIS — N401 Enlarged prostate with lower urinary tract symptoms: Secondary | ICD-10-CM

## 2022-02-04 DIAGNOSIS — N138 Other obstructive and reflux uropathy: Secondary | ICD-10-CM

## 2022-02-04 DIAGNOSIS — R319 Hematuria, unspecified: Secondary | ICD-10-CM

## 2022-02-04 LAB — URINALYSIS, COMPLETE
Bilirubin, UA: NEGATIVE
Glucose, UA: NEGATIVE
Ketones, UA: NEGATIVE
Leukocytes,UA: NEGATIVE
Nitrite, UA: NEGATIVE
Protein,UA: NEGATIVE
RBC, UA: NEGATIVE
Specific Gravity, UA: <1.005 — ABNORMAL LOW (ref 1.005–1.030)
Urobilinogen, Ur: 0.2 mg/dL (ref 0.2–1.0)
pH, UA: 6 (ref 5.0–7.5)

## 2022-02-04 LAB — BLADDER SCAN AMB NON-IMAGING

## 2022-02-04 LAB — MICROSCOPIC EXAMINATION: Bacteria, UA: NONE SEEN

## 2022-02-04 NOTE — Telephone Encounter (Signed)
Patient informed normal PSA results, 0.4, verbalized understanding.

## 2022-02-04 NOTE — Progress Notes (Signed)
7:47 PM   Jason Tran 02-17-1966 [id number]  Referring provider: [name], MD [place],  [place] [id number]  Urological history: 1. High risk hematuria - former smoker - CTU 2017 No renal, ureteral or bladder calculi or mass. Multiple benign appearing renal cysts.  Bilateral adrenal gland adenomas. - Cysto 2016 noted mild inflammation of the prostate -no report of gross heme -UA negative for micro heme  2. BPH with LU TS - PSA, 09/2021 - 0.6 - I PSS 11/3 - PVR 263 mL - tamulosin 0.4 mg twice daily   3. Bilateral adrenal adenomas - CT 2019 Stable bilateral adrenal masses, consistent benign adrenal adenomas  Chief Complaint  Patient presents with   Benign Prostatic Hypertrophy    HPI: Jason Tran is a 56 y.o. male who presents today for yearly follow up.   At his visit on 11/03/2021, he continued to have issues with a weak urinary stream, intermittency and hesitancy.  PVR 135 mL his tamsulosin was increased to 0.4 mg twice daily.  He ran out of tamsulosin a few weeks ago and his symptoms have worsened.  He states that when he is on the tamsulosin twice daily his symptoms are much improved.  Patient denies any modifying or aggravating factors.  Patient denies any gross hematuria, dysuria or suprapubic/flank pain.  Patient denies any fevers, chills, nausea or vomiting.    I PSS 11/3  PVR 263 mL   IPSS     Row Name 02/04/22 1500         International Prostate Symptom Score   How often have you had the sensation of not emptying your bladder? Less than 1 in 5     How often have you had to urinate less than every two hours? Less than 1 in 5 times     How often have you found you stopped and started again several times when you urinated? About half the time     How often have you found it difficult to postpone urination? Less than 1 in 5 times     How often have you had a weak urinary stream? Less than half the time     How often have you had  to strain to start urination? Less than 1 in 5 times     How many times did you typically get up at night to urinate? 2 Times     Total IPSS Score 11       Quality of Life due to urinary symptoms   If you were to spend the rest of your life with your urinary condition just the way it is now how would you feel about that? 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 ENDOSCOPY;  Service: Endoscopy;  Laterality: N/A;   CYSTOSCOPY     ETHMOIDECTOMY Bilateral 10/16/2019   Procedure: ETHMOIDECTOMY;  Surgeon: Beverly Gust, MD;  Location: [facility] ORS;  Service: ENT;  Laterality: Bilateral;   IMAGE GUIDED SINUS SURGERY N/A 10/16/2019   Procedure: IMAGE GUIDED SINUS SURGERY;  Surgeon: Beverly Gust, MD;  Location: [facility] ORS;  Service: ENT;  Laterality: N/A;   MAXILLARY ANTROSTOMY Bilateral 10/16/2019   Procedure: MAXILLARY ANTROSTOMY;  Surgeon: Beverly Gust, MD;  Location: [facility] ORS;  Service: ENT;  Laterality: Bilateral;   NASAL TURBINATE REDUCTION Bilateral 10/16/2019   Procedure: TURBINATE REDUCTION/SUBMUCOSAL RESECTION;  Surgeon: Beverly Gust,  MD;  Location: [facility] ORS;  Service: ENT;  Laterality: Bilateral;   SEPTOPLASTY N/A 10/16/2019   Procedure: SEPTOPLASTY;  Surgeon: Beverly Gust, MD;  Location: [facility] ORS;  Service: ENT;  Laterality: N/A;    Home Medications:  Allergies as of 02/04/2022   No Known Allergies      Medication List        Accurate as of February 04, 2022 11:59 PM. If you have any questions, ask your nurse or doctor.          STOP taking these medications    sulfamethoxazole-trimethoprim 800-160 MG tablet Commonly known as: BACTRIM DS Stopped by: [name], PA-C       TAKE these medications    allopurinol 100 MG tablet Commonly known as: ZYLOPRIM Take 150 mg by mouth every morning.   amLODipine 10 MG tablet Commonly known as: NORVASC Take 10 mg by mouth daily.   carvedilol 12.5 MG tablet Commonly known as: COREG Take 12.5 mg by mouth 2 (two) times daily with a meal.   chlorthalidone 25 MG tablet Commonly known as: HYGROTON Take 25 mg by mouth every morning.   cyanocobalamin 1000 MCG tablet Commonly known as: VITAMIN B12 Take 1,000 mcg by mouth daily.   eplerenone 50 MG tablet Commonly known as: INSPRA Take 50 mg by mouth every morning.   ergocalciferol 1.25 MG (50000 UT) capsule Commonly known as: VITAMIN D2 Take 1 capsule by mouth once a week.   etodolac 500 MG tablet Commonly known as: LODINE Take 500 mg by mouth daily as needed (pain).   finasteride 5 MG tablet Commonly known as: PROSCAR Take 1 tablet (5 mg total) by mouth daily.   furosemide 20 MG tablet Commonly known as: LASIX Take 20 mg by mouth daily.   indomethacin 50 MG capsule Commonly known as: INDOCIN Take 50 mg by mouth daily as needed for pain.   isosorbide mononitrate 30 MG 24 hr tablet Commonly known as: IMDUR Take 30 mg by mouth daily.   olmesartan 40 MG tablet Commonly known as: BENICAR Take 40 mg by mouth daily.   tamsulosin 0.4 MG Caps capsule Commonly known as: FLOMAX Take 2  capsules (0.8 mg total) by mouth daily.   Tribenzor 40-10-25 MG Tabs Generic drug: Olmesartan-amLODIPine-HCTZ Take 1 tablet by mouth every morning.        Allergies: No Known Allergies  Family History: Family History  Problem Relation Age of Onset   Benign prostatic hyperplasia Other    Hypertension Other    Kidney disease Mother    Kidney disease Maternal Uncle  Prostate cancer Neg Hx    Kidney cancer Neg Hx    Bladder Cancer Neg Hx     Social History:  reports that he quit smoking about 15 years ago. His smoking use included cigarettes. He has a 29.00 pack-year smoking history. He has never used smokeless tobacco. He reports current alcohol use. He reports that he does not use drugs.  ROS: For pertinent review of systems please refer to history of present illness  Physical Exam: BP (!) 170/102   Pulse (!) 57   Ht '6\' 2"'$  (1.88 m)   Wt (!) 323 lb (146.5 kg)   BMI 41.47 kg/m   Constitutional:  Well nourished. Alert and oriented, No acute distress. HEENT: [place] AT, moist mucus membranes.  Trachea midline Cardiovascular: No clubbing, cyanosis, or edema. Respiratory: Normal respiratory effort, no increased work of breathing. Neurologic: Grossly intact, no focal deficits, moving all 4 extremities. Psychiatric: Normal mood and affect.   Laboratory data: Results for orders placed or performed in visit on 02/04/22  Microscopic Examination   Urine  Result Value Ref Range   WBC, UA 0-5 0 - 5 /hpf   RBC, Urine 0-2 0 - 2 /hpf   Epithelial Cells (non renal) 0-10 0 - 10 /hpf   Bacteria, UA None seen None seen/Few  Urinalysis, Complete  Result Value Ref Range   Specific Gravity, UA <1.005 (L) 1.005 - 1.030   pH, UA 6.0 5.0 - 7.5   Color, UA Straw Yellow   Appearance Ur Clear Clear   Leukocytes,UA Negative Negative   Protein,UA Negative Negative/Trace   Glucose, UA Negative Negative   Ketones, UA Negative Negative   RBC, UA Negative Negative   Bilirubin, UA Negative  Negative   Urobilinogen, Ur 0.2 0.2 - 1.0 mg/dL   Nitrite, UA Negative Negative   Microscopic Examination See below:   Bladder Scan (Post Void Residual) in office  Result Value Ref Range   Scan Result 28m     I have reviewed the labs.  Pertinent Imaging  02/04/22 15:32  Scan Result 2672m   Assessment & Plan:    1. BPH with LUTS - PSA up to date - Continues to have issues with obstructive symptoms although they are improved when he is taking the tamsulosin twice daily -We will initiate finasteride 5 mg daily-advised of side effects  2. High risk hematuria - Completed CTU in 2017 - no malignancies found - Patient refused follow up cystoscopy after 2017 CTU,  but did undergo a cystoscopy in 2016 which was NED - no reports of gross heme - UA negative for micro heme  Return in about 1 year (around 02/05/2023) for IPSS, SHIM, PSA, UA and exam.  These notes generated with voice recognition software. I apologize for typographical errors.  [email or web address] [id number]

## 2022-02-05 ENCOUNTER — Encounter: Payer: Self-pay | Admitting: Gastroenterology

## 2022-02-05 ENCOUNTER — Encounter: Payer: Self-pay | Admitting: Urology

## 2022-02-08 ENCOUNTER — Ambulatory Visit: Payer: BC Managed Care – PPO | Admitting: Anesthesiology

## 2022-02-08 ENCOUNTER — Encounter
Admission: RE | Disposition: A | Discharge status: Home / Self Care | Payer: Self-pay | Source: Home / Self Care | Attending: Gastroenterology

## 2022-02-08 ENCOUNTER — Other Ambulatory Visit: Payer: Self-pay

## 2022-02-08 ENCOUNTER — Ambulatory Visit
Admission: RE | Admit: 2022-02-08 | Discharge: 2022-02-08 | Disposition: A | Discharge status: Home / Self Care | Payer: BC Managed Care – PPO | Attending: Gastroenterology | Admitting: Gastroenterology

## 2022-02-08 ENCOUNTER — Encounter: Payer: Self-pay | Admitting: Gastroenterology

## 2022-02-08 DIAGNOSIS — K5730 Diverticulosis of large intestine without perforation or abscess without bleeding: Secondary | ICD-10-CM | POA: Insufficient documentation

## 2022-02-08 DIAGNOSIS — D120 Benign neoplasm of cecum: Secondary | ICD-10-CM | POA: Diagnosis not present

## 2022-02-08 DIAGNOSIS — I10 Essential (primary) hypertension: Secondary | ICD-10-CM | POA: Insufficient documentation

## 2022-02-08 DIAGNOSIS — Z86010 Personal history of colonic polyps: Secondary | ICD-10-CM | POA: Insufficient documentation

## 2022-02-08 DIAGNOSIS — K219 Gastro-esophageal reflux disease without esophagitis: Secondary | ICD-10-CM | POA: Diagnosis not present

## 2022-02-08 DIAGNOSIS — G4730 Sleep apnea, unspecified: Secondary | ICD-10-CM | POA: Insufficient documentation

## 2022-02-08 DIAGNOSIS — Z1211 Encounter for screening for malignant neoplasm of colon: Secondary | ICD-10-CM | POA: Insufficient documentation

## 2022-02-08 DIAGNOSIS — Z79899 Other long term (current) drug therapy: Secondary | ICD-10-CM | POA: Diagnosis not present

## 2022-02-08 DIAGNOSIS — Q619 Cystic kidney disease, unspecified: Secondary | ICD-10-CM | POA: Diagnosis not present

## 2022-02-08 DIAGNOSIS — D124 Benign neoplasm of descending colon: Secondary | ICD-10-CM | POA: Diagnosis not present

## 2022-02-08 DIAGNOSIS — D122 Benign neoplasm of ascending colon: Secondary | ICD-10-CM | POA: Insufficient documentation

## 2022-02-08 DIAGNOSIS — D123 Benign neoplasm of transverse colon: Secondary | ICD-10-CM | POA: Insufficient documentation

## 2022-02-08 DIAGNOSIS — Z87891 Personal history of nicotine dependence: Secondary | ICD-10-CM | POA: Diagnosis not present

## 2022-02-08 DIAGNOSIS — K640 First degree hemorrhoids: Secondary | ICD-10-CM | POA: Diagnosis not present

## 2022-02-08 HISTORY — PX: COLONOSCOPY: SHX5424

## 2022-02-08 SURGERY — COLONOSCOPY
Anesthesia: General

## 2022-02-08 MED ORDER — STERILE WATER FOR IRRIGATION IR SOLN
Status: DC | PRN
  Administered 2022-02-08: 50 mL

## 2022-02-08 MED ORDER — LIDOCAINE HCL (CARDIAC) PF 100 MG/5ML IV SOSY
PREFILLED_SYRINGE | INTRAVENOUS | Status: DC | PRN
  Administered 2022-02-08: 50 mg via INTRAVENOUS

## 2022-02-08 MED ORDER — PROPOFOL 500 MG/50ML IV EMUL
INTRAVENOUS | Status: DC | PRN
  Administered 2022-02-08: 150 ug/kg/min via INTRAVENOUS

## 2022-02-08 MED ORDER — SODIUM CHLORIDE 0.9 % IV SOLN: INTRAVENOUS | Status: DC

## 2022-02-08 MED ORDER — PROPOFOL 10 MG/ML IV BOLUS
INTRAVENOUS | Status: DC | PRN
  Administered 2022-02-08: 80 mg via INTRAVENOUS
  Administered 2022-02-08: 20 mg via INTRAVENOUS

## 2022-02-08 NOTE — Transfer of Care (Signed)
Immediate Anesthesia Transfer of Care Note  Patient: Jason Tran  Procedure(s) Performed: COLONOSCOPY  Patient Location: PACU  Anesthesia Type:General  Level of Consciousness: sedated  Airway & Oxygen Therapy: Patient Spontanous Breathing  Post-op Assessment: Report given to RN and Post -op Vital signs reviewed and stable  Post vital signs: Reviewed and stable  Last Vitals:  Vitals Value Taken Time  BP 115/70 02/08/22 1338  Temp    Pulse 55 02/08/22 1339  Resp 14 02/08/22 1339  SpO2 92 % 02/08/22 1339  Vitals shown include unvalidated device data.  Last Pain:  Vitals:   02/08/22 1139  PainSc: 0-No pain         Complications: No notable events documented.

## 2022-02-08 NOTE — Anesthesia Procedure Notes (Signed)
Date/Time: 02/08/2022 12:57 PM  Performed by: Johnna Acosta, CRNAPre-anesthesia Checklist: Patient identified, Emergency Drugs available, Suction available, Patient being monitored and Timeout performed Patient Re-evaluated:Patient Re-evaluated prior to induction Oxygen Delivery Method: Nasal cannula Preoxygenation: Pre-oxygenation with 100% oxygen Induction Type: IV induction

## 2022-02-08 NOTE — Anesthesia Postprocedure Evaluation (Signed)
Anesthesia Post Note  Patient: Jason Tran  Procedure(s) Performed: COLONOSCOPY  Anesthesia Type: General Anesthetic complications: no   No notable events documented.   Last Vitals:  Vitals:   02/08/22 1348 02/08/22 1358  BP: (!) 131/91 (!) 141/101  Pulse:  (!) 52  Resp: 14 15  Temp:    SpO2:  99%    Last Pain:  Vitals:   02/08/22 1358  TempSrc:   PainSc: 0-No pain                 VAN STAVEREN,Osmel Dykstra

## 2022-02-08 NOTE — H&P (Signed)
Pre-Procedure H&P   Patient ID: Jason Tran is a 56 y.o. male.  Gastroenterology Provider: Annamaria Helling, DO  Referring Provider: Dawson Bills, NP PCP: [name], MD  Date: 02/08/2022  HPI Jason Tran is a 56 y.o. male who presents today for surveillance- phx polyps.  Patient last underwent colonoscopy in June 2017 demonstrating 1 tubular adenomatous polyp.  He was also noted at the time to have internal hemorrhoids.  Bowel movements have been moving regularly.  No melena hematochezia diarrhea or constipation.  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   Disorder of kidney 11/21/2014   Elevated PSA    GERD (gastroesophageal reflux disease)    H/O   Gout    HTN (hypertension)    Prostatitis 10/21/2014   Renal disease    Sleep apnea    DOES NOT USE A CPAP    Past Surgical History:  Procedure Laterality Date   COLONOSCOPY WITH PROPOFOL N/A 09/15/2015   Procedure: COLONOSCOPY WITH PROPOFOL;  Surgeon: [name], MD;  Location: [facility];  Service: Endoscopy;  Laterality: N/A;   CYSTOSCOPY     ETHMOIDECTOMY Bilateral 10/16/2019   Procedure: ETHMOIDECTOMY;  Surgeon: Beverly Gust, MD;  Location: [facility] ORS;  Service: ENT;  Laterality: Bilateral;   IMAGE GUIDED SINUS SURGERY N/A 10/16/2019   Procedure: IMAGE GUIDED SINUS SURGERY;  Surgeon: Beverly Gust, MD;  Location: [facility] ORS;  Service: ENT;  Laterality: N/A;   MAXILLARY ANTROSTOMY Bilateral 10/16/2019   Procedure: MAXILLARY ANTROSTOMY;  Surgeon: Beverly Gust,  MD;  Location: [facility] OR[facility];  Service: ENT;  Laterality: Bilateral;   NASAL TURBINATE REDUCTION Bilateral 10/16/2019   Procedure: TURBINATE REDUCTION/SUBMUCOSAL RESECTION;  Surgeon: Beverly Gust, MD;  Location: [facility] ORS;  Service: ENT;  Laterality: Bilateral;   SEPTOPLASTY N/A 10/16/2019   Procedure: SEPTOPLASTY;  Surgeon: Beverly Gust, MD;  Location: [facility] ORS;  Service: ENT;  Laterality: N/A;    Family History No h/o GI disease or malignancy  Review of Systems  Constitutional:  Negative for activity change, appetite change, chills, diaphoresis, fatigue, fever and unexpected weight change.  HENT:  Negative for trouble swallowing and voice change.   Respiratory:  Negative for shortness of breath and wheezing.   Cardiovascular:  Negative for chest pain, palpitations and leg swelling.  Gastrointestinal:  Negative for abdominal distention, abdominal pain, anal bleeding, blood in stool, constipation, diarrhea, nausea and vomiting.  Musculoskeletal:  Negative for arthralgias and myalgias.  Skin:  Negative for color change and pallor.  Neurological:  Negative for dizziness, syncope and weakness.  Psychiatric/Behavioral:  Negative for confusion. The patient is not nervous/anxious.   All other systems reviewed and are negative.    Medications No current facility-administered medications on file prior to encounter.   Current Outpatient Medications on File Prior to Encounter  Medication Sig Dispense Refill   allopurinol (ZYLOPRIM) 100 MG tablet Take 150 mg by mouth every morning.      amLODipine (NORVASC) 10 MG tablet Take 10 mg by mouth daily.  carvedilol (COREG) 12.5 MG tablet Take 12.5 mg by mouth 2 (two) times daily with a meal.     chlorthalidone (HYGROTON) 25 MG tablet Take 25 mg by mouth every morning.     eplerenone (INSPRA) 50 MG tablet Take 50 mg by mouth every morning.      ergocalciferol (VITAMIN D2) 1.25 MG (50000 UT) capsule Take 1 capsule by mouth once a week.     etodolac  (LODINE) 500 MG tablet Take 500 mg by mouth daily as needed (pain).     finasteride (PROSCAR) 5 MG tablet Take 1 tablet (5 mg total) by mouth daily. 90 tablet 3   furosemide (LASIX) 20 MG tablet Take 20 mg by mouth daily.   0   indomethacin (INDOCIN) 50 MG capsule Take 50 mg by mouth daily as needed for pain.     isosorbide mononitrate (IMDUR) 30 MG 24 hr tablet Take 30 mg by mouth daily.     olmesartan (BENICAR) 40 MG tablet Take 40 mg by mouth daily.     tamsulosin (FLOMAX) 0.4 MG CAPS capsule Take 2 capsules (0.8 mg total) by mouth daily. 180 capsule 3   TRIBENZOR 40-10-25 MG TABS Take 1 tablet by mouth every morning.   0   vitamin B-12 (CYANOCOBALAMIN) 1000 MCG tablet Take 1,000 mcg by mouth daily.       Pertinent medications related to GI and procedure were reviewed by me with the patient prior to the procedure   Current Facility-Administered Medications:    0.9 %  sodium chloride infusion, , Intravenous, Continuous, Annamaria Helling, DO, Last Rate: 20 mL/hr at 02/08/22 1200, New Bag at 02/08/22 1200  sodium chloride 20 mL/hr at 02/08/22 1200       No Known Allergies Allergies were reviewed by me prior to the procedure  Objective   Body mass index is 38 kg/m. Vitals:   02/08/22 1139  BP: (!) 167/106  Pulse: (!) 55  Resp: 16  Temp: (!) 97.1 F (36.2 C)  SpO2: 97%  Weight: 136.1 kg  Height: 6' 2.5" (1.892 m)     Physical Exam Vitals and nursing note reviewed.  Constitutional:      General: He is not in acute distress.    Appearance: Normal appearance. He is obese. He is not ill-appearing, toxic-appearing or diaphoretic.  HENT:     Head: Normocephalic and atraumatic.     Nose: Nose normal.     Mouth/Throat:     Mouth: Mucous membranes are moist.     Pharynx: Oropharynx is clear.  Eyes:     General: No scleral icterus.    Extraocular Movements: Extraocular movements intact.  Cardiovascular:     Rate and Rhythm: Regular rhythm. Bradycardia present.      Heart sounds: Normal heart sounds. No murmur heard.    No friction rub. No gallop.  Pulmonary:     Effort: Pulmonary effort is normal. No respiratory distress.     Breath sounds: Normal breath sounds. No wheezing, rhonchi or rales.  Abdominal:     General: Bowel sounds are normal. There is no distension.     Palpations: Abdomen is soft.     Tenderness: There is no abdominal tenderness. There is no guarding or rebound.  Musculoskeletal:     Cervical back: Neck supple.     Right lower leg: No edema.     Left lower leg: No edema.  Skin:    General: Skin is warm and dry.     Coloration:  Skin is not jaundiced or pale.  Neurological:     General: No focal deficit present.     Mental Status: He is alert and oriented to person, place, and time. Mental status is at baseline.  Psychiatric:        Mood and Affect: Mood normal.        Behavior: Behavior normal.        Thought Content: Thought content normal.        Judgment: Judgment normal.      Assessment:  Jason Tran is a 56 y.o. male  who presents today for Colonoscopy for surveillance- phx polyps .  Plan:  Colonoscopy with possible intervention today  Colonoscopy with possible biopsy, control of bleeding, polypectomy, and interventions as necessary has been discussed with the patient/patient representative. Informed consent was obtained from the patient/patient representative after explaining the indication, nature, and risks of the procedure including but not limited to death, bleeding, perforation, missed neoplasm/lesions, cardiorespiratory compromise, and reaction to medications. Opportunity for questions was given and appropriate answers were provided. Patient/patient representative has verbalized understanding is amenable to undergoing the procedure.   Annamaria Helling, DO  [facility]  Portions of the record may have been created with voice recognition software. Occasional wrong-word or  'sound-a-like' substitutions may have occurred due to the inherent limitations of voice recognition software.  Read the chart carefully and recognize, using context, where substitutions may have occurred.

## 2022-02-08 NOTE — Anesthesia Preprocedure Evaluation (Signed)
Anesthesia Evaluation  Patient identified by MRN, date of birth, ID band Patient awake    Reviewed: Allergy & Precautions, NPO status , Patient's Chart, lab work & pertinent test results  Airway Mallampati: II  TM Distance: >3 FB Neck ROM: Full    Dental  (+) Teeth Intact   Pulmonary sleep apnea , former smoker    + decreased breath sounds      Cardiovascular Exercise Tolerance: Good hypertension, Pt. on medications  Rhythm:Regular     Neuro/Psych negative neurological ROS  negative psych ROS   GI/Hepatic Neg liver ROS,GERD  Medicated,,  Endo/Other  negative endocrine ROS    Renal/GU      Musculoskeletal   Abdominal Normal abdominal exam  (+)   Peds negative pediatric ROS (+)  Hematology negative hematology ROS (+)   Anesthesia Other Findings   Reproductive/Obstetrics                             Anesthesia Physical Anesthesia Plan  ASA: 3  Anesthesia Plan: General   Post-op Pain Management:    Induction: Intravenous  PONV Risk Score and Plan:   Airway Management Planned: Natural Airway  Additional Equipment:   Intra-op Plan:   Post-operative Plan:   Informed Consent: I have reviewed the patients History and Physical, chart, labs and discussed the procedure including the risks, benefits and alternatives for the proposed anesthesia with the patient or authorized representative who has indicated his/her understanding and acceptance.       Plan Discussed with: CRNA and Surgeon  Anesthesia Plan Comments:        Anesthesia Quick Evaluation

## 2022-02-08 NOTE — Op Note (Signed)
[facility]astroenterology Patient Name: Jason Tran Procedure Date: 02/08/2022 12:53 PM MRN: [id number] Account #: [id number] Date of Birth: 07/14/1965 Admit Type: Outpatient Age: 56 Room: [facility] ENDO ROOM 2 Gender: Male Note Status: Finalized Instrument Name: Colonscope [id number] Procedure:             Colonoscopy Indications:           High risk colon cancer surveillance: Personal history                         of colonic polyps Providers:             Annamaria Helling DO, DO Medicines:             Monitored Anesthesia Care Complications:         No immediate complications. Estimated blood loss:                         Minimal. Procedure:             Pre-Anesthesia Assessment:                        - Prior to the procedure, a History and Physical was                         performed, and patient medications and allergies were                         reviewed. The patient is competent. The risks and                         benefits of the procedure and the sedation options and                         risks were discussed with the patient. All questions                         were answered and informed consent was obtained.                         Patient identification and proposed procedure were                         verified by the physician, the nurse, the anesthetist                         and the technician in the endoscopy suite. Mental                         Status Examination: alert and oriented. Airway                         Examination: normal oropharyngeal airway and neck                         mobility. Respiratory Examination: clear to                         auscultation. CV Examination: RRR, no murmurs, no S3  or S4. Prophylactic Antibiotics: The patient does not                         require prophylactic antibiotics. Prior                         Anticoagulants: The patient has taken no anticoagulant                          or antiplatelet agents. ASA Grade Assessment: III - A                         patient with severe systemic disease. After reviewing                         the risks and benefits, the patient was deemed in                         satisfactory condition to undergo the procedure. The                         anesthesia plan was to use monitored anesthesia care                         (MAC). Immediately prior to administration of                         medications, the patient was re-assessed for adequacy                         to receive sedatives. The heart rate, respiratory                         rate, oxygen saturations, blood pressure, adequacy of                         pulmonary ventilation, and response to care were                         monitored throughout the procedure. The physical                         status of the patient was re-assessed after the                         procedure.                        After obtaining informed consent, the colonoscope was                         passed under direct vision. Throughout the procedure,                         the patient's blood pressure, pulse, and oxygen                         saturations were monitored continuously. The  Colonoscope was introduced through the anus and                         advanced to the the cecum, identified by appendiceal                         orifice and ileocecal valve. The colonoscopy was                         performed without difficulty. The patient tolerated                         the procedure well. The quality of the bowel                         preparation was evaluated using the BBPS [facility] Bowel                         Preparation Scale) with scores of: Right Colon = 2                         (minor amount of residual staining, small fragments of                         stool and/or opaque liquid, but mucosa seen well),                          Transverse Colon = 3 (entire mucosa seen well with no                         residual staining, small fragments of stool or opaque                         liquid) and Left Colon = 2 (minor amount of residual                         staining, small fragments of stool and/or opaque                         liquid, but mucosa seen well). The total BBPS score                         equals 7. The quality of the bowel preparation was                         good. Findings:      The perianal and digital rectal examinations were normal. Pertinent       negatives include normal sphincter tone.      Scattered small-mouthed diverticula were found in the entire colon.       Estimated blood loss: none.      Non-bleeding internal hemorrhoids were found during retroflexion. The       hemorrhoids were Grade I (internal hemorrhoids that do not prolapse).       Estimated blood loss: none.      Two sessile polyps were found in the descending colon and cecum. The       polyps were 3 to  4 mm in size. These polyps were removed with a cold       snare. Resection and retrieval were complete. Estimated blood loss was       minimal.      Five sessile polyps were found in the descending colon (1), transverse       colon (2) and ascending colon (2). The polyps were 1 to 2 mm in size.       These polyps were removed with a jumbo cold forceps. Resection and       retrieval were complete. Estimated blood loss was minimal.      The exam was otherwise without abnormality on direct and retroflexion       views. Impression:            - Diverticulosis in the entire examined colon.                        - Non-bleeding internal hemorrhoids.                        - Two 3 to 4 mm polyps in the descending colon and in                         the cecum, removed with a cold snare. Resected and                         retrieved.                        - Five 1 to 2 mm polyps in the descending colon, in                          the transverse colon and in the ascending colon,                         removed with a jumbo cold forceps. Resected and                         retrieved.                        - The examination was otherwise normal on direct and                         retroflexion views. Recommendation:        - Patient has a contact number available for                         emergencies. The signs and symptoms of potential                         delayed complications were discussed with the patient.                         Return to normal activities tomorrow. Written                         discharge instructions were provided to the patient.                        -  Discharge patient to home.                        - Resume previous diet.                        - Continue present medications.                        - No ibuprofen, naproxen, or other non-steroidal                         anti-inflammatory drugs for 5 days after polyp removal.                        - Await pathology results.                        - Repeat colonoscopy for surveillance based on                         pathology results.                        - Return to GI clinic as previously scheduled.                        - The findings and recommendations were discussed with                         the patient. Procedure Code(s):     --- Professional ---                        [phone], Colonoscopy, flexible; with removal of                         tumor(s), polyp(s), or other lesion(s) by snare                         technique                        45380, 53, Colonoscopy, flexible; with biopsy, single                         or multiple Diagnosis Code(s):     --- Professional ---                        Z86.010, Personal history of colonic polyps                        K64.0, First degree hemorrhoids                        D12.0, Benign neoplasm of cecum                        D12.4, Benign neoplasm of descending colon                         D12.3, Benign neoplasm of transverse colon (hepatic  flexure or splenic flexure)                        D12.2, Benign neoplasm of ascending colon                        K57.30, Diverticulosis of large intestine without                         perforation or abscess without bleeding CPT copyright 2022 American [employer] Association. All rights reserved. The codes documented in this report are preliminary and upon coder review may  be revised to meet current compliance requirements. Attending Participation:      I personally performed the entire procedure. Volney American, DO Annamaria Helling DO, DO 02/08/2022 1:40:59 PM This report has been signed electronically. Number of Addenda: 0 Note Initiated On: 02/08/2022 12:53 PM Scope Withdrawal Time: 0 hours 27 minutes 3 seconds  Total Procedure Duration: 0 hours 32 minutes 45 seconds  Estimated Blood Loss:  Estimated blood loss was minimal.      [facility]

## 2022-02-08 NOTE — Interval H&P Note (Signed)
History and Physical Interval Note: Preprocedure H&P from 02/08/22  was reviewed and there was no interval change after seeing and examining the patient.  Written consent was obtained from the patient after discussion of risks, benefits, and alternatives. Patient has consented to proceed with Colonoscopy with possible intervention   02/08/2022 12:10 PM  Jason Tran  has presented today for surgery, with the diagnosis of History of adenomatous polyp of colon (Z86.010).  The various methods of treatment have been discussed with the patient and family. After consideration of risks, benefits and other options for treatment, the patient has consented to  Procedure(s): COLONOSCOPY (N/A) as a surgical intervention.  The patient's history has been reviewed, patient examined, no change in status, stable for surgery.  I have reviewed the patient's chart and labs.  Questions were answered to the patient's satisfaction.     Annamaria Helling

## 2022-02-09 ENCOUNTER — Encounter: Payer: Self-pay | Admitting: Gastroenterology

## 2022-02-09 LAB — SURGICAL PATHOLOGY

## 2022-03-01 NOTE — Progress Notes (Unsigned)
2:23 PM   Jason Tran [birth year] [id number]  Referring provider: [name], MD Jason Tran,  [place] [address]  Urological history: 1. High risk hematuria - former smoker - CTU 2017 No renal, ureteral or bladder calculi or mass. Multiple benign appearing renal cysts.  Bilateral adrenal gland adenomas. - Cysto 2016 noted mild inflammation of the prostate -no report of gross heme -UA negative for micro heme   2. BPH with LU TS - PSA, 09/2021 - 0.6 - PVR 181 mL - tamulosin 0.4 mg twice daily and finasteride 5 mg daily   3. Bilateral adrenal adenomas - CT 2019 Stable bilateral adrenal masses, consistent benign adrenal adenomas  Chief Complaint  Patient presents with   Recurrent UTI    HPI: Jason Tran is a 56 y.o. male who presents today for UTI.    He has been experiencing urinary urgency and some slight dysuria for one week.  He is taking the tamsulosin 0.4 mg BID and finasteride 5 mg daily.  Patient denies any modifying or aggravating factors.  Patient denies any gross hematuria, dysuria or suprapubic/flank pain.  Patient denies any fevers, chills, nausea or vomiting.    UA yellow clear, specific gravity less than 1.005, pH 6.0, trace leukocyte, 0-5 WBCs, 0-2 RBCs, 0-10 epithelial cells.    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   Disorder of kidney 11/21/2014   Elevated PSA    GERD (gastroesophageal reflux disease)    H/O   Gout    HTN (hypertension)    Prostatitis 10/21/2014   Renal disease    Sleep apnea    DOES NOT USE A CPAP    Surgical History: Past Surgical  History:  Procedure Laterality Date   COLONOSCOPY N/A 02/08/2022   Procedure: COLONOSCOPY;  Surgeon: Jason Helling, DO;  Location: [facility] ENDOSCOPY;  Service: Gastroenterology;  Laterality: N/A;   COLONOSCOPY WITH PROPOFOL N/A 09/15/2015   Procedure: COLONOSCOPY WITH PROPOFOL;  Surgeon: Jason Silvas, MD;  Location: [facility] ENDOSCOPY;  Service: Endoscopy;  Laterality: N/A;   CYSTOSCOPY     ETHMOIDECTOMY Bilateral 10/16/2019   Procedure: ETHMOIDECTOMY;  Surgeon: Jason Gust, MD;  Location: [facility] ORS;  Service: ENT;  Laterality: Bilateral;   IMAGE GUIDED SINUS SURGERY N/A 10/16/2019   Procedure: IMAGE GUIDED SINUS SURGERY;  Surgeon: Jason Gust, MD;  Location: [facility] ORS;  Service: ENT;  Laterality: N/A;   MAXILLARY ANTROSTOMY Bilateral 10/16/2019   Procedure: MAXILLARY ANTROSTOMY;  Surgeon: Jason Gust, MD;  Location: [facility] ORS;  Service: ENT;  Laterality: Bilateral;   NASAL TURBINATE REDUCTION Bilateral 10/16/2019   Procedure: TURBINATE REDUCTION/SUBMUCOSAL RESECTION;  Surgeon: Jason Gust, MD;  Location: [facility] ORS;  Service: ENT;  Laterality: Bilateral;   SEPTOPLASTY N/A 10/16/2019   Procedure: SEPTOPLASTY;  Surgeon: Jason Gust, MD;  Location: [facility] ORS;  Service: ENT;  Laterality: N/A;    Home Medications:  Allergies as of 03/02/2022   No Known Allergies      Medication List        Accurate as of March 02, 2022  2:23  PM. If you have any questions, ask your nurse or doctor.          allopurinol 100 MG tablet Commonly known as: ZYLOPRIM Take 150 mg by mouth every morning.   amLODipine 10 MG tablet Commonly known as: NORVASC Take 10 mg by mouth daily.   carvedilol 12.5 MG tablet Commonly known as: COREG Take 12.5 mg by mouth 2 (two) times daily with a meal.   chlorthalidone 25 MG tablet Commonly known as: HYGROTON Take 25 mg by mouth every morning.   cyanocobalamin 1000 MCG tablet Commonly known as: VITAMIN B12 Take 1,000 mcg by mouth  daily.   eplerenone 50 MG tablet Commonly known as: INSPRA Take 50 mg by mouth every morning.   ergocalciferol 1.25 MG (50000 UT) capsule Commonly known as: VITAMIN D2 Take 1 capsule by mouth once a week.   etodolac 500 MG tablet Commonly known as: LODINE Take 500 mg by mouth daily as needed (pain).   finasteride 5 MG tablet Commonly known as: PROSCAR Take 1 tablet (5 mg total) by mouth daily.   furosemide 20 MG tablet Commonly known as: LASIX Take 20 mg by mouth daily.   indomethacin 50 MG capsule Commonly known as: INDOCIN Take 50 mg by mouth daily as needed for pain.   isosorbide mononitrate 30 MG 24 hr tablet Commonly known as: IMDUR Take 30 mg by mouth daily.   olmesartan 40 MG tablet Commonly known as: BENICAR Take 40 mg by mouth daily.   tamsulosin 0.4 MG Caps capsule Commonly known as: FLOMAX Take 2 capsules (0.8 mg total) by mouth daily.   Tribenzor 40-10-25 MG Tabs Generic drug: Olmesartan-amLODIPine-HCTZ Take 1 tablet by mouth every morning.        Allergies: No Known Allergies  Family History: Family History  Problem Relation Age of Onset   Benign prostatic hyperplasia Other    Hypertension Other    Kidney disease Mother    Kidney disease Maternal Uncle    Prostate cancer Neg Hx    Kidney cancer Neg Hx    Bladder Cancer Neg Hx     Social History:  reports that he quit smoking about 15 years ago. His smoking use included cigarettes. He has a 29.00 pack-year smoking history. He has never used smokeless tobacco. He reports current alcohol use. He reports that he does not use drugs.  ROS: For pertinent review of systems please refer to history of present illness  Physical Exam: BP (!) 169/101   Pulse 60   Ht 6' 2.5" (1.892 m)   Wt (!) 312 lb (141.5 kg)   BMI 39.52 kg/m   Constitutional:  Well nourished. Alert and oriented, No acute distress. HEENT: [place] AT, moist mucus membranes.  Trachea midline Cardiovascular: No clubbing, cyanosis, or  edema. Respiratory: Normal respiratory effort, no increased work of breathing. Neurologic: Grossly intact, no focal deficits, moving all 4 extremities. Psychiatric: Normal mood and affect.   Laboratory data: See HPI and EPIC I have reviewed the labs.  Pertinent Imaging  03/02/22 13:54  Scan Result 181     Assessment & Plan:    1. BPH with LUTS -Discussed with patient that his symptoms are likely due to BPH rather than infection especially with a negative urine  -I will send out for urine culture and for atypicals to rule out any subclinical infection -I did discuss with him undergoing a repeat cystoscopy and explained that he is on maximum medical therapy at this time and he may benefit from  a bladder outlet procedure which in turn would allow him to come off his medication possibly and he is agreeable -If urine cultures are negative we will go ahead and pursue cystoscopy  2. High risk hematuria - Completed CTU in 2017 - no malignancies found - Patient refused follow up cystoscopy after 2017 CTU,  but did undergo a cystoscopy in 2016 which was NED - no reports of gross heme - UA negative for micro heme  Return for pending urine culture results .  These notes generated with voice recognition software. I apologize for typographical errors.  [place], [place] [address] [place], [place] [address] [phone]

## 2022-03-02 ENCOUNTER — Encounter: Payer: Self-pay | Admitting: Urology

## 2022-03-02 ENCOUNTER — Ambulatory Visit: Payer: BC Managed Care – PPO | Admitting: Urology

## 2022-03-02 VITALS — BP 169/101 | HR 60 | Ht 74.5 in | Wt 312.0 lb

## 2022-03-02 DIAGNOSIS — N138 Other obstructive and reflux uropathy: Secondary | ICD-10-CM | POA: Diagnosis not present

## 2022-03-02 DIAGNOSIS — N401 Enlarged prostate with lower urinary tract symptoms: Secondary | ICD-10-CM | POA: Diagnosis not present

## 2022-03-02 DIAGNOSIS — R300 Dysuria: Secondary | ICD-10-CM | POA: Diagnosis not present

## 2022-03-02 DIAGNOSIS — R3915 Urgency of urination: Secondary | ICD-10-CM | POA: Diagnosis not present

## 2022-03-02 DIAGNOSIS — R319 Hematuria, unspecified: Secondary | ICD-10-CM

## 2022-03-02 DIAGNOSIS — R3989 Other symptoms and signs involving the genitourinary system: Secondary | ICD-10-CM

## 2022-03-02 LAB — URINALYSIS, COMPLETE
Bilirubin, UA: NEGATIVE
Glucose, UA: NEGATIVE
Ketones, UA: NEGATIVE
Nitrite, UA: NEGATIVE
Protein,UA: NEGATIVE
RBC, UA: NEGATIVE
Specific Gravity, UA: <1.005 — ABNORMAL LOW (ref 1.005–1.030)
Urobilinogen, Ur: 0.2 mg/dL (ref 0.2–1.0)
pH, UA: 6 (ref 5.0–7.5)

## 2022-03-02 LAB — BLADDER SCAN AMB NON-IMAGING: Scan Result: 181

## 2022-03-02 LAB — MICROSCOPIC EXAMINATION: Bacteria, UA: NONE SEEN

## 2022-03-04 LAB — CULTURE, URINE COMPREHENSIVE

## 2022-03-10 ENCOUNTER — Telehealth: Payer: Self-pay | Admitting: Family Medicine

## 2022-03-10 LAB — MYCOPLASMA / UREAPLASMA CULTURE
Mycoplasma hominis Culture: NEGATIVE
Ureaplasma urealyticum: NEGATIVE

## 2022-03-10 NOTE — Telephone Encounter (Signed)
Patient notified and voiced understanding. Appointment was made for March per patient request.

## 2022-03-10 NOTE — Telephone Encounter (Signed)
-----   Message from Nori Riis, PA-C sent at 03/10/2022  6:29 AM EST ----- Please let Mr. Dorko know that his cultures were negative for infection and we need to get him scheduled for cysto/TRUS w/ any physician.

## 2022-06-03 ENCOUNTER — Encounter: Payer: BC Managed Care – PPO | Admitting: Urology

## 2023-02-07 NOTE — Progress Notes (Deleted)
10:21 AM   Lemont Fillers April 27, 1965 [id number]  Referring provider: Marina Goodell, MD [address] [place],  [place] [address]  Urological history: 1. High risk hematuria - former smoker - CTU 2017 No renal, ureteral or bladder calculi or mass. Multiple benign appearing renal cysts.  Bilateral adrenal gland adenomas. - Cysto 2016 noted mild inflammation of the prostate  2. BPH with LU TS - PSA, 09/2021 - 0.6 - tamulosin 0.4 mg twice daily and finasteride 5 mg daily   3. Bilateral adrenal adenomas - CT 2019 Stable bilateral adrenal masses, consistent benign adrenal adenomas  No chief complaint on file.  HPI: Jason Tran is a 57 y.o. male who presents today for yearly follow up.   Previous records reviewed.   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   Disorder of kidney 11/21/2014   Elevated PSA    GERD (gastroesophageal reflux disease)    H/O   Gout    HTN (hypertension)    Prostatitis 10/21/2014   Renal disease    Sleep apnea    DOES NOT USE A CPAP    Surgical History: Past Surgical History:  Procedure Laterality Date   COLONOSCOPY N/A 02/08/2022   Procedure: COLONOSCOPY;  Surgeon: Jaynie Collins, DO;  Location: [facility] ENDOSCOPY;  Service: Gastroenterology;  Laterality: N/A;   COLONOSCOPY WITH PROPOFOL N/A 09/15/2015   Procedure: COLONOSCOPY WITH PROPOFOL;  Surgeon: Scot Jun, MD;   Location: [facility] ENDOSCOPY;  Service: Endoscopy;  Laterality: N/A;   CYSTOSCOPY     ETHMOIDECTOMY Bilateral 10/16/2019   Procedure: ETHMOIDECTOMY;  Surgeon: Linus Salmons, MD;  Location: [facility] ORS;  Service: ENT;  Laterality: Bilateral;   IMAGE GUIDED SINUS SURGERY N/A 10/16/2019   Procedure: IMAGE GUIDED SINUS SURGERY;  Surgeon: Linus Salmons, MD;  Location: [facility] ORS;  Service: ENT;  Laterality: N/A;   MAXILLARY ANTROSTOMY Bilateral 10/16/2019   Procedure: MAXILLARY ANTROSTOMY;  Surgeon: Linus Salmons, MD;  Location: [facility] ORS;  Service: ENT;  Laterality: Bilateral;   NASAL TURBINATE REDUCTION Bilateral 10/16/2019   Procedure: TURBINATE REDUCTION/SUBMUCOSAL RESECTION;  Surgeon: Linus Salmons, MD;  Location: [facility] ORS;  Service: ENT;  Laterality: Bilateral;   SEPTOPLASTY N/A 10/16/2019   Procedure: SEPTOPLASTY;  Surgeon: Linus Salmons, MD;  Location: [facility] ORS;  Service: ENT;  Laterality: N/A;    Home Medications:  Allergies as of 02/08/2023   No Known Allergies      Medication List        Accurate as of February 07, 2023 10:21 AM. If you have any questions, ask your nurse or doctor.          allopurinol 100 MG tablet Commonly known as: ZYLOPRIM Take 150 mg by mouth every morning.   amLODipine 10 MG tablet Commonly known as: NORVASC Take 10 mg by mouth daily.  carvedilol 12.5 MG tablet Commonly known as: COREG Take 12.5 mg by mouth 2 (two) times daily with a meal.   chlorthalidone 25 MG tablet Commonly known as: HYGROTON Take 25 mg by mouth every morning.   cyanocobalamin 1000 MCG tablet Commonly known as: VITAMIN B12 Take 1,000 mcg by mouth daily.   eplerenone 50 MG tablet Commonly known as: INSPRA Take 50 mg by mouth every morning.   ergocalciferol 1.25 MG (50000 UT) capsule Commonly known as: VITAMIN D2 Take 1 capsule by mouth once a week.   etodolac 500 MG tablet Commonly known as: LODINE Take 500 mg by mouth daily as needed (pain).   finasteride  5 MG tablet Commonly known as: PROSCAR Take 1 tablet (5 mg total) by mouth daily.   furosemide 20 MG tablet Commonly known as: LASIX Take 20 mg by mouth daily.   indomethacin 50 MG capsule Commonly known as: INDOCIN Take 50 mg by mouth daily as needed for pain.   isosorbide mononitrate 30 MG 24 hr tablet Commonly known as: IMDUR Take 30 mg by mouth daily.   olmesartan 40 MG tablet Commonly known as: BENICAR Take 40 mg by mouth daily.   tamsulosin 0.4 MG Caps capsule Commonly known as: FLOMAX Take 2 capsules (0.8 mg total) by mouth daily.   Tribenzor 40-10-25 MG Tabs Generic drug: Olmesartan-amLODIPine-HCTZ Take 1 tablet by mouth every morning.        Allergies: No Known Allergies  Family History: Family History  Problem Relation Age of Onset   Benign prostatic hyperplasia Other    Hypertension Other    Kidney disease Mother    Kidney disease Maternal Uncle    Prostate cancer Neg Hx    Kidney cancer Neg Hx    Bladder Cancer Neg Hx     Social History:  reports that he quit smoking about 16 years ago. His smoking use included cigarettes. He started smoking about 45 years ago. He has a 29 pack-year smoking history. He has never used smokeless tobacco. He reports current alcohol use. He reports that he does not use drugs.  ROS: For pertinent review of systems please refer to history of present illness  Physical Exam: There were no vitals taken for this visit.  Constitutional:  Well nourished. Alert and oriented, No acute distress. HEENT: [place] AT, moist mucus membranes.  Trachea midline, no masses. Cardiovascular: No clubbing, cyanosis, or edema. Respiratory: Normal respiratory effort, no increased work of breathing. GI: Abdomen is soft, non tender, non distended, no abdominal masses. Liver and spleen not palpable.  No hernias appreciated.  Stool sample for occult testing is not indicated.   GU: No CVA tenderness.  No bladder fullness or masses.  Patient with  circumcised/uncircumcised phallus. ***Foreskin easily retracted***  Urethral meatus is patent.  No penile discharge. No penile lesions or rashes. Scrotum without lesions, cysts, rashes and/or edema.  Testicles are located scrotally bilaterally. No masses are appreciated in the testicles. Left and right epididymis are normal. Rectal: Patient with  normal sphincter tone. Anus and perineum without scarring or rashes. No rectal masses are appreciated. Prostate is approximately *** grams, *** nodules are appreciated. Seminal vesicles are normal. Skin: No rashes, bruises or suspicious lesions. Lymph: No cervical or inguinal adenopathy. Neurologic: Grossly intact, no focal deficits, moving all 4 extremities. Psychiatric: Normal mood and affect.   Laboratory data: Pending  Urinalysis See EPIC and HPI I have reviewed the labs.  Pertinent Imaging ***   Assessment & Plan:    1.  BPH with LUTS -Discussed with patient that his symptoms are likely due to BPH rather than infection especially with a negative urine  -I will send out for urine culture and for atypicals to rule out any subclinical infection -I did discuss with him undergoing a repeat cystoscopy and explained that he is on maximum medical therapy at this time and he may benefit from a bladder outlet procedure which in turn would allow him to come off his medication possibly and he is agreeable -If urine cultures are negative we will go ahead and pursue cystoscopy  2. High risk hematuria - Completed CTU in 2017 - no malignancies found - Patient refused follow up cystoscopy after 2017 CTU,  but did undergo a cystoscopy in 2016 which was NED  No follow-ups on file.  These notes generated with voice recognition software. I apologize for typographical errors.  Cloretta Ned  [facility] [address] [place], [place] [address] [phone]

## 2023-02-08 ENCOUNTER — Ambulatory Visit: Payer: BC Managed Care – PPO | Admitting: Urology

## 2023-02-08 ENCOUNTER — Encounter: Payer: Self-pay | Admitting: Urology

## 2023-02-08 DIAGNOSIS — R319 Hematuria, unspecified: Secondary | ICD-10-CM

## 2023-02-08 DIAGNOSIS — N401 Enlarged prostate with lower urinary tract symptoms: Secondary | ICD-10-CM

## 2023-02-11 ENCOUNTER — Encounter: Payer: Self-pay | Admitting: Urology
